# Patient Record
Sex: Female | Born: 1990 | Race: White | Hispanic: No | Marital: Married | Smoking: Former smoker
Health system: Southern US, Community
[De-identification: ages and names within clinical notes are randomized; demographics above are authoritative.]

## PROBLEM LIST (undated history)

## (undated) DIAGNOSIS — I878 Other specified disorders of veins: Secondary | ICD-10-CM

## (undated) DIAGNOSIS — G35 Multiple sclerosis: Secondary | ICD-10-CM

## (undated) DIAGNOSIS — Z915 Personal history of self-harm: Secondary | ICD-10-CM

## (undated) DIAGNOSIS — Z8739 Personal history of other diseases of the musculoskeletal system and connective tissue: Secondary | ICD-10-CM

## (undated) DIAGNOSIS — Z9151 Personal history of suicidal behavior: Secondary | ICD-10-CM

## (undated) DIAGNOSIS — Z87898 Personal history of other specified conditions: Secondary | ICD-10-CM

## (undated) DIAGNOSIS — G514 Facial myokymia: Secondary | ICD-10-CM

## (undated) DIAGNOSIS — Z98811 Dental restoration status: Secondary | ICD-10-CM

## (undated) DIAGNOSIS — F32A Depression, unspecified: Secondary | ICD-10-CM

## (undated) DIAGNOSIS — F419 Anxiety disorder, unspecified: Secondary | ICD-10-CM

## (undated) DIAGNOSIS — F329 Major depressive disorder, single episode, unspecified: Secondary | ICD-10-CM

## (undated) HISTORY — DX: Multiple sclerosis: G35

## (undated) HISTORY — PX: APPENDECTOMY: SHX54

---
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
## 2014-07-27 ENCOUNTER — Emergency Department (HOSPITAL_COMMUNITY): Payer: BC Managed Care – PPO

## 2014-07-27 ENCOUNTER — Emergency Department (HOSPITAL_COMMUNITY): Payer: Self-pay

## 2014-07-27 ENCOUNTER — Inpatient Hospital Stay (HOSPITAL_COMMUNITY)
Admission: EM | Admit: 2014-07-27 | Discharge: 2014-07-28 | DRG: 059 | Disposition: A | Discharge status: Home Health | Payer: BC Managed Care – PPO | Attending: Internal Medicine | Admitting: Internal Medicine

## 2014-07-27 ENCOUNTER — Encounter (HOSPITAL_COMMUNITY): Payer: Self-pay | Admitting: Emergency Medicine

## 2014-07-27 DIAGNOSIS — H5462 Unqualified visual loss, left eye, normal vision right eye: Secondary | ICD-10-CM

## 2014-07-27 DIAGNOSIS — G35 Multiple sclerosis: Principal | ICD-10-CM | POA: Diagnosis present

## 2014-07-27 DIAGNOSIS — Z87891 Personal history of nicotine dependence: Secondary | ICD-10-CM

## 2014-07-27 DIAGNOSIS — H53132 Sudden visual loss, left eye: Secondary | ICD-10-CM | POA: Diagnosis not present

## 2014-07-27 DIAGNOSIS — H469 Unspecified optic neuritis: Secondary | ICD-10-CM | POA: Diagnosis present

## 2014-07-27 MED ORDER — GADOBENATE DIMEGLUMINE 529 MG/ML IV SOLN
20.0000 mL | Freq: Once | INTRAVENOUS | Status: AC | PRN
  Administered 2014-07-27: 20 mL via INTRAVENOUS

## 2014-07-27 NOTE — ED Notes (Signed)
The patient was sent here from [facility] to get an MRI.   The patient went to [facility] because she woke up on Sunday and her vision was "dim".  Today she said she can only see "white and shiny" through her left eye.  [facility] hospital did a CT scan and it was negative.  They have sent here to do an MRI to rule out MS.

## 2014-07-27 NOTE — ED Notes (Signed)
Patient transported to MRI 

## 2014-07-28 ENCOUNTER — Encounter (HOSPITAL_COMMUNITY): Payer: Self-pay | Admitting: Internal Medicine

## 2014-07-28 DIAGNOSIS — H469 Unspecified optic neuritis: Secondary | ICD-10-CM | POA: Diagnosis present

## 2014-07-28 DIAGNOSIS — Z87891 Personal history of nicotine dependence: Secondary | ICD-10-CM | POA: Diagnosis not present

## 2014-07-28 DIAGNOSIS — G35 Multiple sclerosis: Secondary | ICD-10-CM | POA: Diagnosis present

## 2014-07-28 DIAGNOSIS — H53132 Sudden visual loss, left eye: Secondary | ICD-10-CM | POA: Diagnosis present

## 2014-07-28 DIAGNOSIS — H5462 Unqualified visual loss, left eye, normal vision right eye: Secondary | ICD-10-CM

## 2014-07-28 LAB — CBC WITH DIFFERENTIAL/PLATELET
BASOS PCT: 0 % (ref 0–1)
Basophils Absolute: 0 10*3/uL (ref 0.0–0.1)
Eosinophils Absolute: 0.1 10*3/uL (ref 0.0–0.7)
Eosinophils Relative: 1 % (ref 0–5)
HCT: 37.6 % (ref 36.0–46.0)
HEMOGLOBIN: 12.4 g/dL (ref 12.0–15.0)
LYMPHS ABS: 3 10*3/uL (ref 0.7–4.0)
Lymphocytes Relative: 48 % — ABNORMAL HIGH (ref 12–46)
MCH: 28.2 pg (ref 26.0–34.0)
MCHC: 33 g/dL (ref 30.0–36.0)
MCV: 85.5 fL (ref 78.0–100.0)
MONO ABS: 0.3 10*3/uL (ref 0.1–1.0)
MONOS PCT: 4 % (ref 3–12)
NEUTROS ABS: 2.9 10*3/uL (ref 1.7–7.7)
NEUTROS PCT: 47 % (ref 43–77)
Platelets: 267 10*3/uL (ref 150–400)
RBC: 4.4 MIL/uL (ref 3.87–5.11)
RDW: 13.3 % (ref 11.5–15.5)
WBC: 6.2 10*3/uL (ref 4.0–10.5)

## 2014-07-28 LAB — APTT: aPTT: 29 seconds (ref 24–37)

## 2014-07-28 LAB — BASIC METABOLIC PANEL
Anion gap: 12 (ref 5–15)
BUN: 9 mg/dL (ref 6–23)
CHLORIDE: 102 meq/L (ref 96–112)
CO2: 23 meq/L (ref 19–32)
Calcium: 9.4 mg/dL (ref 8.4–10.5)
Creatinine, Ser: 0.8 mg/dL (ref 0.50–1.10)
GFR calc Af Amer: >90 mL/min (ref >90)
GFR calc non Af Amer: >90 mL/min (ref >90)
GLUCOSE: 86 mg/dL (ref 70–99)
POTASSIUM: 3.7 meq/L (ref 3.7–5.3)
Sodium: 137 mEq/L (ref 137–147)

## 2014-07-28 LAB — SEDIMENTATION RATE: SED RATE: 18 mm/h (ref 0–22)

## 2014-07-28 LAB — TSH: TSH: 6.06 u[IU]/mL — AB (ref 0.350–4.500)

## 2014-07-28 LAB — T4, FREE: Free T4: 1 ng/dL (ref 0.80–1.80)

## 2014-07-28 LAB — PROTIME-INR
INR: 1.01 (ref 0.00–1.49)
Prothrombin Time: 13.4 seconds (ref 11.6–15.2)

## 2014-07-28 LAB — RPR

## 2014-07-28 LAB — HIV ANTIBODY (ROUTINE TESTING W REFLEX): HIV: NONREACTIVE

## 2014-07-28 MED ORDER — SODIUM CHLORIDE 0.9 % IV SOLN
500.0000 mg | Freq: Once | INTRAVENOUS | Status: AC
  Administered 2014-07-28: 500 mg via INTRAVENOUS
  Filled 2014-07-28: qty 4

## 2014-07-28 MED ORDER — NORGESTIMATE-ETH ESTRADIOL 0.25-35 MG-MCG PO TABS: 1.0000 | ORAL_TABLET | Freq: Every day | ORAL | Status: DC

## 2014-07-28 MED ORDER — SODIUM CHLORIDE 0.9 % IV SOLN: 1000.0000 mg | INTRAVENOUS | Status: DC

## 2014-07-28 MED ORDER — PANTOPRAZOLE SODIUM 40 MG PO TBEC: 40.0000 mg | DELAYED_RELEASE_TABLET | Freq: Every day | ORAL | Status: DC

## 2014-07-28 MED ORDER — SODIUM CHLORIDE 0.9 % IV SOLN
500.0000 mg | Freq: Two times a day (BID) | INTRAVENOUS | Status: DC
  Administered 2014-07-28 (×2): 500 mg via INTRAVENOUS
  Filled 2014-07-28 (×3): qty 4

## 2014-07-28 MED ORDER — SODIUM CHLORIDE 0.9 % IV SOLN: 500.0000 mg | Freq: Two times a day (BID) | INTRAVENOUS | Status: DC

## 2014-07-28 NOTE — Plan of Care (Signed)
Problem: Phase I Progression Outcomes Goal: Pain controlled with appropriate interventions Outcome: Completed/Met Date Met:  07/28/14 Goal: OOB as tolerated unless otherwise ordered Outcome: Completed/Met Date Met:  07/28/14 Goal: Voiding-avoid urinary catheter unless indicated Outcome: Completed/Met Date Met:  07/28/14

## 2014-07-28 NOTE — Discharge Summary (Signed)
Physician Discharge Summary  Patient ID: Aliesha Kreie MRN: [id number] DOB/AGE: 04-Mar-1991 23 y.o.  Admit date: 07/27/2014 Discharge date: 07/28/2014  Primary Care Physician:  No primary care provider on file.  Discharge Diagnoses:    . Optic neuritis Possible new diagnosis of MS   Consults:neurology, Dr. Hosie Poisson   Recommendations for Outpatient Follow-up:  Patient was recommended IV high-dose steroids for 5 days, home health arranged  Patient declined inpatient MRI of the C-spine, will need to be done outpatient  Please follow-up on T3 and T4, TSH is 6.0  Allergies:  No Known Allergies   Discharge Medications:   Medication List    TAKE these medications        methylPREDNISolone sodium succinate 1,000 mg in sodium chloride 0.9 % 50 mL  Inject 1,000 mg into the vein daily. X 4 more days  Start taking on:  07/29/2014     SPRINTEC 28 0.25-35 MG-MCG tablet  Generic drug:  norgestimate-ethinyl estradiol  Take 1 tablet by mouth daily.         Brief H and P: For complete details please refer to admission H and P, but in brief patient is a 23 year old female who presented for evaluation of acute vision loss in her left eye. Patient reported that her symptoms started 2 days prior to admission, sudden dimming of her vision in the left eye, flashes of light in wavy lines in her left eye. She noted clear vision in the left superior nasal quadrant otherwise per very. She also had discomfort in her left eye with right gaze, went to ophthalmologist who noted optic no problem and papilledema and patient was sent to the ED for further evaluation.  Hospital Course:     Optic neuritis: patient is a 23 year old female with unremarkable medical history presented with acute onset of visual loss in the left eye. Neurology was consulted. Patient underwent MRI of the brain which showed multiple hyperdense lesions involving the supratentorial white matter, highly concerning for multiple  sclerosis, 5 mL enhancing lesion in the left occipital lobe consistent with active demyelination, no other enhancing lesions identified in the brain. Asymmetric enhancement in the prechiasmatic left optic nerve suspicious for active optic neuritis. Neurology recommended starting high-dose IV Solu-Medrol, for total of 5 days. Discussed in detail with Dr. Amada Jupiter prior to discharge, who did not think patient needs an LP at this time, the symptoms are most consistent with multiple sclerosis and recommended IV Solu-Medrol for 5 days.  Patient requested to be discharged, her mother is a Engineer, civil (consulting) and reported comfort with giving her IV Solu-Medrol at home.home health was arranged with case manager assistance. Patient declined inpatient MRI of the C-spine, will need to be done outpatient.  Day of Discharge BP 120/71 mmHg  Pulse 70  Temp(Src) 97.8 F (36.6 C) (Oral)  Resp 16  Ht 5\' 10"  (1.778 m)  Wt 101.2 kg (223 lb 1.7 oz)  BMI 32.01 kg/m2  SpO2 100%  LMP 06/29/2014  Physical Exam: General: Alert and awake oriented x3 not in any acute distress. CVS: S1-S2 clear no murmur rubs or gallops Chest: clear to auscultation bilaterally, no wheezing rales or rhonchi Abdomen: soft nontender, nondistended, normal bowel sounds Extremities: no cyanosis, clubbing or edema noted bilaterally    The results of significant diagnostics from this hospitalization (including imaging, microbiology, ancillary and laboratory) are listed below for reference.    LAB RESULTS: Basic Metabolic Panel:  Recent Labs Lab 07/28/14 0005  NA 137  K 3.7  CL 102  CO2 23  GLUCOSE 86  BUN 9  CREATININE 0.80  CALCIUM 9.4   Liver Function Tests: No results for input(s): AST, ALT, ALKPHOS, BILITOT, PROT, ALBUMIN in the last 168 hours. No results for input(s): LIPASE, AMYLASE in the last 168 hours. No results for input(s): AMMONIA in the last 168 hours. CBC:  Recent Labs Lab 07/28/14 0005  WBC 6.2  NEUTROABS 2.9   HGB 12.4  HCT 37.6  MCV 85.5  PLT 267   Cardiac Enzymes: No results for input(s): CKTOTAL, CKMB, CKMBINDEX, TROPONINI in the last 168 hours. BNP: Invalid input(s): POCBNP CBG: No results for input(s): GLUCAP in the last 168 hours.  Significant Diagnostic Studies:  Mr Lodema Pilot Contrast  07/28/2014   CLINICAL DATA:  Initial evaluation for acute vision loss and left eye. Concern for demyelinating disease.  EXAM: MRI HEAD AND ORBITS WITHOUT AND WITH CONTRAST  TECHNIQUE: Multiplanar, multiecho pulse sequences of the brain and surrounding structures were obtained without and with intravenous contrast. Multiplanar, multiecho pulse sequences of the orbits and surrounding structures were obtained including fat saturation techniques, before and after intravenous contrast administration.  CONTRAST:  20mL MULTIHANCE GADOBENATE DIMEGLUMINE 529 MG/ML IV SOLN  COMPARISON:  None available.  FINDINGS: MRI HEAD FINDINGS  Multiple patchy T2/FLAIR hyperintensities are seen involving the white matter of both cerebral hemispheres. Specifically, these involve the periventricular, deep, and juxta cortical white matter. Several of these lesions are oriented perpendicular to the lateral ventricles, most evident on axial FLAIR sequence (series 6, image 19). The most prominent lesion is a juxta cortical lesion within the anterior left frontal lobe which measures 1.4 cm (series 6, image 21). No infratentorial lesions identified. A single 5 mm lesion within the left occipital cortex demonstrates post-contrast enhancement, suggesting active demyelination (series 17, image 34). No other enhancing lesions identified. Findings are highly suspicious for multiple sclerosis.  No abnormal foci of restricted diffusion identified to suggest acute intracranial infarct. Gray-white matter differentiation maintained. Normal flow voids seen within the intracranial vasculature. No intracranial hemorrhage.  No mass lesion or midline shift.  Ventricles are normal in size without evidence of hydrocephalus. No extra-axial fluid collection.  Incidental note made of a small 6 mm nonenhancing T1 hypointense, T2 hyperintense cyst within the pineal gland. Pituitary gland unremarkable.  Craniocervical junction within normal limits. Visualized upper cervical spine is unremarkable.  Visualized bone marrow is within normal limits. Scalp soft tissues are unremarkable.  Paranasal sinuses are clear. No mastoid effusion. Inner ear structures are within normal limits.  MRI ORBITS FINDINGS  There is asymmetric enhancement within the pre chiasmatic left optic nerve near the left orbital apex (series 20, image 18). The left optic nerve is slightly swollen in appearance at this level as well. The optic chiasm itself is within normal limits. No definite abnormal enhancement seen within the right optic nerve. Optic chiasm is normal. Globes themselves are within normal limits. Extraocular muscles are normal. Intraconal and extraconal fat is within normal limits.  IMPRESSION: 1. Multiple T2/FLAIR hyperintense lesions involving the supratentorial white matter as above, highly concerning for multiple sclerosis. 5 mm enhancing lesion within the left occipital lobe most consistent with active demyelination. No other enhancing lesions identified within the brain. 2. Asymmetric enhancement within the pre chiasmatic left optic nerve, suspicious for active optic neuritis.   Electronically Signed   By: Rise Mu M.D.   On: 07/28/2014 00:02   Mr Birdie Hopes Wo/w Cm  07/28/2014   CLINICAL DATA:  Initial evaluation for acute  vision loss and left eye. Concern for demyelinating disease.  EXAM: MRI HEAD AND ORBITS WITHOUT AND WITH CONTRAST  TECHNIQUE: Multiplanar, multiecho pulse sequences of the brain and surrounding structures were obtained without and with intravenous contrast. Multiplanar, multiecho pulse sequences of the orbits and surrounding structures were obtained  including fat saturation techniques, before and after intravenous contrast administration.  CONTRAST:  20mL MULTIHANCE GADOBENATE DIMEGLUMINE 529 MG/ML IV SOLN  COMPARISON:  None available.  FINDINGS: MRI HEAD FINDINGS  Multiple patchy T2/FLAIR hyperintensities are seen involving the white matter of both cerebral hemispheres. Specifically, these involve the periventricular, deep, and juxta cortical white matter. Several of these lesions are oriented perpendicular to the lateral ventricles, most evident on axial FLAIR sequence (series 6, image 19). The most prominent lesion is a juxta cortical lesion within the anterior left frontal lobe which measures 1.4 cm (series 6, image 21). No infratentorial lesions identified. A single 5 mm lesion within the left occipital cortex demonstrates post-contrast enhancement, suggesting active demyelination (series 17, image 34). No other enhancing lesions identified. Findings are highly suspicious for multiple sclerosis.  No abnormal foci of restricted diffusion identified to suggest acute intracranial infarct. Gray-white matter differentiation maintained. Normal flow voids seen within the intracranial vasculature. No intracranial hemorrhage.  No mass lesion or midline shift. Ventricles are normal in size without evidence of hydrocephalus. No extra-axial fluid collection.  Incidental note made of a small 6 mm nonenhancing T1 hypointense, T2 hyperintense cyst within the pineal gland. Pituitary gland unremarkable.  Craniocervical junction within normal limits. Visualized upper cervical spine is unremarkable.  Visualized bone marrow is within normal limits. Scalp soft tissues are unremarkable.  Paranasal sinuses are clear. No mastoid effusion. Inner ear structures are within normal limits.  MRI ORBITS FINDINGS  There is asymmetric enhancement within the pre chiasmatic left optic nerve near the left orbital apex (series 20, image 18). The left optic nerve is slightly swollen in  appearance at this level as well. The optic chiasm itself is within normal limits. No definite abnormal enhancement seen within the right optic nerve. Optic chiasm is normal. Globes themselves are within normal limits. Extraocular muscles are normal. Intraconal and extraconal fat is within normal limits.  IMPRESSION: 1. Multiple T2/FLAIR hyperintense lesions involving the supratentorial white matter as above, highly concerning for multiple sclerosis. 5 mm enhancing lesion within the left occipital lobe most consistent with active demyelination. No other enhancing lesions identified within the brain. 2. Asymmetric enhancement within the pre chiasmatic left optic nerve, suspicious for active optic neuritis.   Electronically Signed   By: Rise MuBenjamin  McClintock M.D.   On: 07/28/2014 00:02       Disposition and Follow-up: Discharge Instructions    Diet general    Complete by:  As directed      Increase activity slowly    Complete by:  As directed             DISPOSITION:home  DIET:regular diet    DISCHARGE FOLLOW-UP Follow-up Information    Follow up with JAFFE, ADAM ROBERT, DO.   Specialty:  Neurology   Why:  OFFICE WILL CALL YOU FOR APPOINTMENT DATE AND TIME, for hospital follow-up   Contact information:   [address] [place] [id number] [phone]       Time spent on Discharge: 35 mins  Signed:   Haruko Mersch M.D. [facility] 07/28/2014, 1:29 PM Pager: [phone]

## 2014-07-28 NOTE — H&P (Addendum)
Beth Santos is an 23 y.o. female.    Pcp: unassigned  Chief Complaint: vision dim HPI: 23 yo female with c/o vision dim and colors not bright,  Starting on Sunday, had increased vision loss in the left eye,  Lower and outer visual field.  Pt went to opthamologist, who told her that the optic nerve was inflammed, and sent her to [facility].  CT scan was negative . Couldn't get MRI at [facility] so came to [facility] for evaluation.  MRI brain showed optic neuritis,   History reviewed. No pertinent past medical history.  Past Surgical History  Procedure Laterality Date  . Appendectomy      History reviewed. No pertinent family history. Social History:  reports that she has quit smoking. She has never used smokeless tobacco. She reports that she drinks alcohol. She reports that she does not use illicit drugs.  Allergies: No Known Allergies   (Not in a hospital admission)  No results found for this or any previous visit (from the past 48 hour(s)). Mr Beth Santos Wo Contrast  07/28/2014   CLINICAL DATA:  Initial evaluation for acute vision loss and left eye. Concern for demyelinating disease.  EXAM: MRI HEAD AND ORBITS WITHOUT AND WITH CONTRAST  TECHNIQUE: Multiplanar, multiecho pulse sequences of the brain and surrounding structures were obtained without and with intravenous contrast. Multiplanar, multiecho pulse sequences of the orbits and surrounding structures were obtained including fat saturation techniques, before and after intravenous contrast administration.  CONTRAST:  40m MULTIHANCE GADOBENATE DIMEGLUMINE 529 MG/ML IV SOLN  COMPARISON:  None available.  FINDINGS: MRI HEAD FINDINGS  Multiple patchy T2/FLAIR hyperintensities are seen involving the white matter of both cerebral hemispheres. Specifically, these involve the periventricular, deep, and juxta cortical white matter. Several of these lesions are oriented perpendicular to the lateral ventricles, most evident on  axial FLAIR sequence (series 6, image 19). The most prominent lesion is a juxta cortical lesion within the anterior left frontal lobe which measures 1.4 cm (series 6, image 21). No infratentorial lesions identified. A single 5 mm lesion within the left occipital cortex demonstrates post-contrast enhancement, suggesting active demyelination (series 17, image 34). No other enhancing lesions identified. Findings are highly suspicious for multiple sclerosis.  No abnormal foci of restricted diffusion identified to suggest acute intracranial infarct. Gray-white matter differentiation maintained. Normal flow voids seen within the intracranial vasculature. No intracranial hemorrhage.  No mass lesion or midline shift. Ventricles are normal in size without evidence of hydrocephalus. No extra-axial fluid collection.  Incidental note made of a small 6 mm nonenhancing T1 hypointense, T2 hyperintense cyst within the pineal gland. Pituitary gland unremarkable.  Craniocervical junction within normal limits. Visualized upper cervical spine is unremarkable.  Visualized bone marrow is within normal limits. Scalp soft tissues are unremarkable.  Paranasal sinuses are clear. No mastoid effusion. Inner ear structures are within normal limits.  MRI ORBITS FINDINGS  There is asymmetric enhancement within the pre chiasmatic left optic nerve near the left orbital apex (series 20, image 18). The left optic nerve is slightly swollen in appearance at this level as well. The optic chiasm itself is within normal limits. No definite abnormal enhancement seen within the right optic nerve. Optic chiasm is normal. Globes themselves are within normal limits. Extraocular muscles are normal. Intraconal and extraconal fat is within normal limits.  IMPRESSION: 1. Multiple T2/FLAIR hyperintense lesions involving the supratentorial white matter as above, highly concerning for multiple sclerosis. 5 mm enhancing lesion within the left occipital  lobe most  consistent with active demyelination. No other enhancing lesions identified within the brain. 2. Asymmetric enhancement within the pre chiasmatic left optic nerve, suspicious for active optic neuritis.   Electronically Signed   By: Jeannine Boga M.D.   On: 07/28/2014 00:02   Mr Beth Santos Wo/w Cm  07/28/2014   CLINICAL DATA:  Initial evaluation for acute vision loss and left eye. Concern for demyelinating disease.  EXAM: MRI HEAD AND ORBITS WITHOUT AND WITH CONTRAST  TECHNIQUE: Multiplanar, multiecho pulse sequences of the brain and surrounding structures were obtained without and with intravenous contrast. Multiplanar, multiecho pulse sequences of the orbits and surrounding structures were obtained including fat saturation techniques, before and after intravenous contrast administration.  CONTRAST:  34m MULTIHANCE GADOBENATE DIMEGLUMINE 529 MG/ML IV SOLN  COMPARISON:  None available.  FINDINGS: MRI HEAD FINDINGS  Multiple patchy T2/FLAIR hyperintensities are seen involving the white matter of both cerebral hemispheres. Specifically, these involve the periventricular, deep, and juxta cortical white matter. Several of these lesions are oriented perpendicular to the lateral ventricles, most evident on axial FLAIR sequence (series 6, image 19). The most prominent lesion is a juxta cortical lesion within the anterior left frontal lobe which measures 1.4 cm (series 6, image 21). No infratentorial lesions identified. A single 5 mm lesion within the left occipital cortex demonstrates post-contrast enhancement, suggesting active demyelination (series 17, image 34). No other enhancing lesions identified. Findings are highly suspicious for multiple sclerosis.  No abnormal foci of restricted diffusion identified to suggest acute intracranial infarct. Gray-white matter differentiation maintained. Normal flow voids seen within the intracranial vasculature. No intracranial hemorrhage.  No mass lesion or midline shift.  Ventricles are normal in size without evidence of hydrocephalus. No extra-axial fluid collection.  Incidental note made of a small 6 mm nonenhancing T1 hypointense, T2 hyperintense cyst within the pineal gland. Pituitary gland unremarkable.  Craniocervical junction within normal limits. Visualized upper cervical spine is unremarkable.  Visualized bone marrow is within normal limits. Scalp soft tissues are unremarkable.  Paranasal sinuses are clear. No mastoid effusion. Inner ear structures are within normal limits.  MRI ORBITS FINDINGS  There is asymmetric enhancement within the pre chiasmatic left optic nerve near the left orbital apex (series 20, image 18). The left optic nerve is slightly swollen in appearance at this level as well. The optic chiasm itself is within normal limits. No definite abnormal enhancement seen within the right optic nerve. Optic chiasm is normal. Globes themselves are within normal limits. Extraocular muscles are normal. Intraconal and extraconal fat is within normal limits.  IMPRESSION: 1. Multiple T2/FLAIR hyperintense lesions involving the supratentorial white matter as above, highly concerning for multiple sclerosis. 5 mm enhancing lesion within the left occipital lobe most consistent with active demyelination. No other enhancing lesions identified within the brain. 2. Asymmetric enhancement within the pre chiasmatic left optic nerve, suspicious for active optic neuritis.   Electronically Signed   By: BJeannine BogaM.D.   On: 07/28/2014 00:02    Review of Systems  Constitutional: Negative for fever, chills, weight loss, malaise/fatigue and diaphoresis.  HENT: Negative for congestion, ear discharge, ear pain, hearing loss, nosebleeds, sore throat and tinnitus.   Eyes: Negative for blurred vision, double vision, photophobia, pain, discharge and redness.  Respiratory: Negative for cough, hemoptysis, sputum production, shortness of breath, wheezing and stridor.    Cardiovascular: Negative for chest pain, palpitations, orthopnea, claudication, leg swelling and PND.  Gastrointestinal: Negative for heartburn, nausea, vomiting, abdominal pain, diarrhea, constipation, blood  in stool and melena.  Genitourinary: Negative for dysuria, urgency, frequency, hematuria and flank pain.  Musculoskeletal: Negative for myalgias, back pain, joint pain, falls and neck pain.  Skin: Negative for itching and rash.  Neurological: Negative for dizziness, tingling, tremors, sensory change, speech change, focal weakness, seizures, loss of consciousness, weakness and headaches.  Endo/Heme/Allergies: Negative for environmental allergies and polydipsia. Does not bruise/bleed easily.  Psychiatric/Behavioral: Negative for depression, suicidal ideas, hallucinations, memory loss and substance abuse. The patient is not nervous/anxious and does not have insomnia.     Blood pressure 141/77, pulse 88, temperature 98.9 F (37.2 C), resp. rate 18, weight 118.956 kg (262 lb 4 oz), last menstrual period 06/29/2014, SpO2 99 %. Physical Exam  Constitutional: She is oriented to person, place, and time. She appears well-developed and well-nourished.  HENT:  Head: Normocephalic and atraumatic.  Eyes: Conjunctivae and EOM are normal. Pupils are equal, round, and reactive to light.  Visual field lower outer decreased in left eye  Neck: Normal range of motion. Neck supple. No JVD present. No tracheal deviation present. No thyromegaly present.  Cardiovascular: Normal rate and regular rhythm.  Exam reveals no gallop and no friction rub.   No murmur heard. Respiratory: Breath sounds normal. No stridor. No respiratory distress. She has no wheezes. She has no rales. She exhibits no tenderness.  GI: Soft. Bowel sounds are normal. She exhibits no distension and no mass. There is no tenderness. There is no rebound and no guarding.  Musculoskeletal: Normal range of motion. She exhibits no edema or  tenderness.  Lymphadenopathy:    She has no cervical adenopathy.  Neurological: She is alert and oriented to person, place, and time. She has normal reflexes. She displays normal reflexes. No cranial nerve deficit. She exhibits normal muscle tone. Coordination normal.  Skin: Skin is warm and dry. No rash noted. No erythema. No pallor.  Psychiatric: She has a normal mood and affect. Her behavior is normal. Judgment and thought content normal.     Assessment/Plan Vision loss  ? Due to multiple sclerosis/optic neuritis Start on solumedrol 55m iv bid  Check tsh, rpr , ana, esr, pt.ptt, hiv Consider MRI c spine, t spine, L spine and May need LP to confirm and look for oligoclonal bands,  Defer to neurolgy Appreciate neurology input   KJani Gravel11/18/2015, 12:31 AM

## 2014-07-28 NOTE — Progress Notes (Signed)
Discharge instructions gone over with patient. Home medications gone over. Prescription given. Follow up appointment to be made. Patient received copied disc of MRI. Also faxed requested information to Dr.Applegate per patient request, and obtained signed release of information. My chart discussed. Follow up with Dr. Adriana Mccallum to be made. Diet and activity discussed. Work note given. Patient verbalized understanding of instructions. Home health is following patient for iv steroids and nsl was left in.

## 2014-07-28 NOTE — Plan of Care (Signed)
Problem: Discharge Progression Outcomes Goal: Discharge plan in place and appropriate Outcome: Completed/Met Date Met:  07/28/14 Goal: Pain controlled with appropriate interventions Outcome: Completed/Met Date Met:  07/28/14 Goal: Hemodynamically stable Outcome: Completed/Met Date Met:  70/14/10 Goal: Complications resolved/controlled Outcome: Completed/Met Date Met:  07/28/14 Goal: Tolerating diet Outcome: Completed/Met Date Met:  07/28/14 Goal: Activity appropriate for discharge plan Outcome: Completed/Met Date Met:  07/28/14

## 2014-07-28 NOTE — Progress Notes (Signed)
Subjective: Patient has remembered previous sepisode of left foot numbness that lasted approximately one week.   Exam: Filed Vitals:   07/28/14 1317  BP: 120/71  Pulse: 70  Temp: 97.8 F (36.6 C)  Resp: 16   Gen: In bed, NAD MS: awake, alert, oriented CN:APD on left, EOMI, VFF Motor: 5/5 throughout Sensory:intact to LT   Impression: 23 yo F with optic neuritis and at least one previous clinical attack. 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   Recommendations: 1) IV solumedrol 1gm daily x 5 days.  2) F/U as outpatient to start immunomodulating therapy.   Ritta Slot, MD [facility] [phone]  If 7pm- 7am, please page neurology on call as listed in AMION.

## 2014-07-28 NOTE — Care Management (Signed)
[facility] set up for [facility] for IV Solumedrol. Ronny Flurry RN BSN [phone]

## 2014-07-28 NOTE — Consult Note (Signed)
Consult Reason for Consult:vision loss Referring Physician: Dr Wilkie Aye [facility] ED  CC: vision loss  HPI: Beth Santos is an 23 y.o. female presenting for evaluation of acute vision loss in her left eye. Symptoms started 2 days ago, notes a sudden dimming of her vision in her left eye, then noted flashes of light and wavy lines in her left eye. Notes clear vision in left superior nasal quadrant, otherwise vision is blurry. Notes discomfort in her left eye with right gaze. Denies any symptoms in her right eye. Went to her eye doctor who noted "an optic nerve problem and papilledema", sent to the ED for further evaluation.    MRI brain and orbits images personally reviewed with findings concerning for MS.  Official read: 1. Multiple T2/FLAIR hyperintense lesions involving the supratentorial white matter as above, highly concerning for multiple sclerosis. 5 mm enhancing lesion within the left occipital lobe most consistent with active demyelination. No other enhancing lesions identified within the brain. 2. Asymmetric enhancement within the pre chiasmatic left optic nerve, suspicious for active optic neuritis.  History reviewed. No pertinent past medical history.  Past Surgical History  Procedure Laterality Date  . Appendectomy      History reviewed. No pertinent family history.  Social History:  reports that she has quit smoking. She has never used smokeless tobacco. She reports that she drinks alcohol. She reports that she does not use illicit drugs.  No Known Allergies  Medications: Prior to Admission:  (Not in a hospital admission)   ROS: Out of a complete 14 system review, the patient complains of only the following symptoms, and all other reviewed systems are negative.  Physical Examination: Blood pressure 141/77, pulse 88, temperature 98.9 F (37.2 C), resp. rate 18, weight 118.956 kg (262 lb 4 oz), last menstrual period 06/29/2014, SpO2 99 %.  Neurologic Examination Mental  Status: Alert, oriented, thought content appropriate.  Speech fluent without evidence of aphasia.  Able to follow 3 step commands without difficulty. Cranial Nerves: II: blurring of optic disc on the left, loss of temporal and inferior nasal VF in OS, intact VF OD, pupils equal, round, reactive to light and accommodation. VA 20/20 OD, 20/200 OS III,IV, VI: ptosis not present, extra-ocular motions intact bilaterally V,VII: smile symmetric, facial light touch sensation normal bilaterally VIII: hearing normal bilaterally IX,X: gag reflex present XI: trapezius strength/neck flexion strength normal bilaterally XII: tongue strength normal  Motor: Right : Upper extremity    Left:     Upper extremity 5/5 deltoid       5/5 deltoid 5/5 biceps      5/5 biceps  5/5 triceps      5/5 triceps 5/5 hand grip      5/5 hand grip  Lower extremity     Lower extremity 5/5 hip flexor      5/5 hip flexor 5/5 quadricep      5/5 quadriceps  5/5 hamstrings     5/5 hamstrings 5/5 plantar flexion       5/5 plantar flexion 5/5 plantar extension     5/5 plantar extension Tone and bulk:normal tone throughout; no atrophy noted Sensory: Pinprick and light touch intact throughout, bilaterally Deep Tendon Reflexes: 2+ and symmetric throughout Plantars: Right: downgoing   Left: downgoing Cerebellar: normal finger-to-nose, normal rapid alternating movements and normal heel-to-shin test Gait: deferred due to multiple leads on in ED  Laboratory Studies:   Basic Metabolic Panel: No results for input(s): NA, K, CL, CO2, GLUCOSE, BUN, CREATININE, CALCIUM, MG,  PHOS in the last 168 hours.  Liver Function Tests: No results for input(s): AST, ALT, ALKPHOS, BILITOT, PROT, ALBUMIN in the last 168 hours. No results for input(s): LIPASE, AMYLASE in the last 168 hours. No results for input(s): AMMONIA in the last 168 hours.  CBC: No results for input(s): WBC, NEUTROABS, HGB, HCT, MCV, PLT in the last 168 hours.  Cardiac  Enzymes: No results for input(s): CKTOTAL, CKMB, CKMBINDEX, TROPONINI in the last 168 hours.  BNP: Invalid input(s): POCBNP  CBG: No results for input(s): GLUCAP in the last 168 hours.  Microbiology: No results found for this or any previous visit.  Coagulation Studies: No results for input(s): LABPROT, INR in the last 72 hours.  Urinalysis: No results for input(s): COLORURINE, LABSPEC, PHURINE, GLUCOSEU, HGBUR, BILIRUBINUR, KETONESUR, PROTEINUR, UROBILINOGEN, NITRITE, LEUKOCYTESUR in the last 168 hours.  Invalid input(s): APPERANCEUR  Lipid Panel:  No results found for: CHOL, TRIG, HDL, CHOLHDL, VLDL, LDLCALC  HgbA1C: No results found for: HGBA1C  Urine Drug Screen:  No results found for: LABOPIA, COCAINSCRNUR, LABBENZ, AMPHETMU, THCU, LABBARB  Alcohol Level: No results for input(s): ETH in the last 168 hours.  Other results:  Imaging: Mr Lodema Pilot Contrast  07/28/2014   CLINICAL DATA:  Initial evaluation for acute vision loss and left eye. Concern for demyelinating disease.  EXAM: MRI HEAD AND ORBITS WITHOUT AND WITH CONTRAST  TECHNIQUE: Multiplanar, multiecho pulse sequences of the brain and surrounding structures were obtained without and with intravenous contrast. Multiplanar, multiecho pulse sequences of the orbits and surrounding structures were obtained including fat saturation techniques, before and after intravenous contrast administration.  CONTRAST:  13mL MULTIHANCE GADOBENATE DIMEGLUMINE 529 MG/ML IV SOLN  COMPARISON:  None available.  FINDINGS: MRI HEAD FINDINGS  Multiple patchy T2/FLAIR hyperintensities are seen involving the white matter of both cerebral hemispheres. Specifically, these involve the periventricular, deep, and juxta cortical white matter. Several of these lesions are oriented perpendicular to the lateral ventricles, most evident on axial FLAIR sequence (series 6, image 19). The most prominent lesion is a juxta cortical lesion within the anterior left  frontal lobe which measures 1.4 cm (series 6, image 21). No infratentorial lesions identified. A single 5 mm lesion within the left occipital cortex demonstrates post-contrast enhancement, suggesting active demyelination (series 17, image 34). No other enhancing lesions identified. Findings are highly suspicious for multiple sclerosis.  No abnormal foci of restricted diffusion identified to suggest acute intracranial infarct. Gray-white matter differentiation maintained. Normal flow voids seen within the intracranial vasculature. No intracranial hemorrhage.  No mass lesion or midline shift. Ventricles are normal in size without evidence of hydrocephalus. No extra-axial fluid collection.  Incidental note made of a small 6 mm nonenhancing T1 hypointense, T2 hyperintense cyst within the pineal gland. Pituitary gland unremarkable.  Craniocervical junction within normal limits. Visualized upper cervical spine is unremarkable.  Visualized bone marrow is within normal limits. Scalp soft tissues are unremarkable.  Paranasal sinuses are clear. No mastoid effusion. Inner ear structures are within normal limits.  MRI ORBITS FINDINGS  There is asymmetric enhancement within the pre chiasmatic left optic nerve near the left orbital apex (series 20, image 18). The left optic nerve is slightly swollen in appearance at this level as well. The optic chiasm itself is within normal limits. No definite abnormal enhancement seen within the right optic nerve. Optic chiasm is normal. Globes themselves are within normal limits. Extraocular muscles are normal. Intraconal and extraconal fat is within normal limits.  IMPRESSION: 1. Multiple T2/FLAIR  hyperintense lesions involving the supratentorial white matter as above, highly concerning for multiple sclerosis. 5 mm enhancing lesion within the left occipital lobe most consistent with active demyelination. No other enhancing lesions identified within the brain. 2. Asymmetric enhancement within  the pre chiasmatic left optic nerve, suspicious for active optic neuritis.   Electronically Signed   By: Rise Mu M.D.   On: 07/28/2014 00:02   Mr Birdie Hopes Wo/w Cm  07/28/2014   CLINICAL DATA:  Initial evaluation for acute vision loss and left eye. Concern for demyelinating disease.  EXAM: MRI HEAD AND ORBITS WITHOUT AND WITH CONTRAST  TECHNIQUE: Multiplanar, multiecho pulse sequences of the brain and surrounding structures were obtained without and with intravenous contrast. Multiplanar, multiecho pulse sequences of the orbits and surrounding structures were obtained including fat saturation techniques, before and after intravenous contrast administration.  CONTRAST:  20mL MULTIHANCE GADOBENATE DIMEGLUMINE 529 MG/ML IV SOLN  COMPARISON:  None available.  FINDINGS: MRI HEAD FINDINGS  Multiple patchy T2/FLAIR hyperintensities are seen involving the white matter of both cerebral hemispheres. Specifically, these involve the periventricular, deep, and juxta cortical white matter. Several of these lesions are oriented perpendicular to the lateral ventricles, most evident on axial FLAIR sequence (series 6, image 19). The most prominent lesion is a juxta cortical lesion within the anterior left frontal lobe which measures 1.4 cm (series 6, image 21). No infratentorial lesions identified. A single 5 mm lesion within the left occipital cortex demonstrates post-contrast enhancement, suggesting active demyelination (series 17, image 34). No other enhancing lesions identified. Findings are highly suspicious for multiple sclerosis.  No abnormal foci of restricted diffusion identified to suggest acute intracranial infarct. Gray-white matter differentiation maintained. Normal flow voids seen within the intracranial vasculature. No intracranial hemorrhage.  No mass lesion or midline shift. Ventricles are normal in size without evidence of hydrocephalus. No extra-axial fluid collection.  Incidental note made of a small  6 mm nonenhancing T1 hypointense, T2 hyperintense cyst within the pineal gland. Pituitary gland unremarkable.  Craniocervical junction within normal limits. Visualized upper cervical spine is unremarkable.  Visualized bone marrow is within normal limits. Scalp soft tissues are unremarkable.  Paranasal sinuses are clear. No mastoid effusion. Inner ear structures are within normal limits.  MRI ORBITS FINDINGS  There is asymmetric enhancement within the pre chiasmatic left optic nerve near the left orbital apex (series 20, image 18). The left optic nerve is slightly swollen in appearance at this level as well. The optic chiasm itself is within normal limits. No definite abnormal enhancement seen within the right optic nerve. Optic chiasm is normal. Globes themselves are within normal limits. Extraocular muscles are normal. Intraconal and extraconal fat is within normal limits.  IMPRESSION: 1. Multiple T2/FLAIR hyperintense lesions involving the supratentorial white matter as above, highly concerning for multiple sclerosis. 5 mm enhancing lesion within the left occipital lobe most consistent with active demyelination. No other enhancing lesions identified within the brain. 2. Asymmetric enhancement within the pre chiasmatic left optic nerve, suspicious for active optic neuritis.   Electronically Signed   By: Rise Mu M.D.   On: 07/28/2014 00:02     Assessment/Plan:  23y/o woman with unremarkable past medical history presenting for evaluation of acute onset vision loss in her left eye. Clinical history consistent with a diagnosis of optic neuritis. MRI brain and orbit findings concerning for MS.   -IV solumedrol 500mg  BID x 3 days with prednisone taper -GI prophylaxis while on steroids -check MRI C spine with and without  contrast -hold on LP at this time -will need outpatient neurology follow up  Elspeth ChoPeter Artie Takayama, DO [facility] [phone]  If 7pm- 7am, please page neurology on  call as listed in AMION. 07/28/2014, 12:30 AM

## 2014-07-28 NOTE — ED Provider Notes (Signed)
CSN: [id number]     Arrival date & time 07/27/14  2027 History   First MD Initiated Contact with Patient 07/27/14 2305     Chief Complaint  Patient presents with  . Loss of Vision    The patient was sent here from [facility] to get an MRI.       (Consider location/radiation/quality/duration/timing/severity/associated sxs/prior Treatment) HPI  This is a 23 year old female with no significant PMH who presents with vision loss in the left eye.  Patient reports onset of symptoms onset Sunday. Patient reports that her vision got "dim." She thought that she needed new glasses. She subsequently has had flashes of light and more "dim and wavy vision out of the left eye." Patient states that she can only see clearly out of the left upper nasal field.  Right eye does not seem to be affected. She denies any prior history of similar symptoms. She denies any headache, weakness, numbness, tingling. Patient did see her eye doctor and he noted "an optic nerve problem." She was sent to the hospital for evaluation for MS. Outside hospital did a CT scan but were unable to perform MRI.  History reviewed. No pertinent past medical history. Past Surgical History  Procedure Laterality Date  . Appendectomy     History reviewed. No pertinent family history. History  Substance Use Topics  . Smoking status: Former Smoker -- 0.50 packs/day  . Smokeless tobacco: Never Used  . Alcohol Use: Yes   OB History    No data available     Review of Systems  Constitutional: Negative for fever.  Eyes: Positive for visual disturbance. Negative for photophobia, pain and redness.  Respiratory: Negative for chest tightness and shortness of breath.   Cardiovascular: Negative for chest pain.  Gastrointestinal: Negative for nausea, vomiting and abdominal pain.  Neurological: Negative for dizziness, weakness, numbness and headaches.  All other systems reviewed and are negative.     Allergies  Review of patient's  allergies indicates no known allergies.  Home Medications   Prior to Admission medications   Medication Sig Start Date End Date Taking? Authorizing Provider  norgestimate-ethinyl estradiol (SPRINTEC 28) 0.25-35 MG-MCG tablet Take 1 tablet by mouth daily.   Yes Historical Provider, MD   BP 141/77 mmHg  Pulse 88  Temp(Src) 98.9 F (37.2 C)  Resp 18  Wt 262 lb 4 oz (118.956 kg)  SpO2 99%  LMP 06/29/2014 Physical Exam  Constitutional: She is oriented to person, place, and time. She appears well-developed and well-nourished.  overweight  HENT:  Head: Normocephalic and atraumatic.  Mouth/Throat: Oropharynx is clear and moist.  Eyes: Conjunctivae and EOM are normal. Pupils are equal, round, and reactive to light.  Cardiovascular: Normal rate, regular rhythm and normal heart sounds.   Pulmonary/Chest: Effort normal. No respiratory distress. She has no wheezes.  Neurological: She is alert and oriented to person, place, and time.  Visual field deficit isolated to the left upper nasal field, otherwise normal, 5 out of 5 strength in all 4 extremities  Skin: Skin is warm and dry.  Psychiatric: She has a normal mood and affect.  Nursing note and vitals reviewed.   ED Course  Procedures (including critical care time) Labs Review Labs Reviewed  CBC WITH DIFFERENTIAL  BASIC METABOLIC PANEL    Imaging Review Mr Laqueta Jean Wo Contrast  07/28/2014   CLINICAL DATA:  Initial evaluation for acute vision loss and left eye. Concern for demyelinating disease.  EXAM: MRI HEAD AND ORBITS  WITHOUT AND WITH CONTRAST  TECHNIQUE: Multiplanar, multiecho pulse sequences of the brain and surrounding structures were obtained without and with intravenous contrast. Multiplanar, multiecho pulse sequences of the orbits and surrounding structures were obtained including fat saturation techniques, before and after intravenous contrast administration.  CONTRAST:  20mL MULTIHANCE GADOBENATE DIMEGLUMINE 529 MG/ML IV SOLN   COMPARISON:  None available.  FINDINGS: MRI HEAD FINDINGS  Multiple patchy T2/FLAIR hyperintensities are seen involving the white matter of both cerebral hemispheres. Specifically, these involve the periventricular, deep, and juxta cortical white matter. Several of these lesions are oriented perpendicular to the lateral ventricles, most evident on axial FLAIR sequence (series 6, image 19). The most prominent lesion is a juxta cortical lesion within the anterior left frontal lobe which measures 1.4 cm (series 6, image 21). No infratentorial lesions identified. A single 5 mm lesion within the left occipital cortex demonstrates post-contrast enhancement, suggesting active demyelination (series 17, image 34). No other enhancing lesions identified. Findings are highly suspicious for multiple sclerosis.  No abnormal foci of restricted diffusion identified to suggest acute intracranial infarct. Gray-white matter differentiation maintained. Normal flow voids seen within the intracranial vasculature. No intracranial hemorrhage.  No mass lesion or midline shift. Ventricles are normal in size without evidence of hydrocephalus. No extra-axial fluid collection.  Incidental note made of a small 6 mm nonenhancing T1 hypointense, T2 hyperintense cyst within the pineal gland. Pituitary gland unremarkable.  Craniocervical junction within normal limits. Visualized upper cervical spine is unremarkable.  Visualized bone marrow is within normal limits. Scalp soft tissues are unremarkable.  Paranasal sinuses are clear. No mastoid effusion. Inner ear structures are within normal limits.  MRI ORBITS FINDINGS  There is asymmetric enhancement within the pre chiasmatic left optic nerve near the left orbital apex (series 20, image 18). The left optic nerve is slightly swollen in appearance at this level as well. The optic chiasm itself is within normal limits. No definite abnormal enhancement seen within the right optic nerve. Optic chiasm is  normal. Globes themselves are within normal limits. Extraocular muscles are normal. Intraconal and extraconal fat is within normal limits.  IMPRESSION: 1. Multiple T2/FLAIR hyperintense lesions involving the supratentorial white matter as above, highly concerning for multiple sclerosis. 5 mm enhancing lesion within the left occipital lobe most consistent with active demyelination. No other enhancing lesions identified within the brain. 2. Asymmetric enhancement within the pre chiasmatic left optic nerve, suspicious for active optic neuritis.   Electronically Signed   By: Rise Mu M.D.   On: 07/28/2014 00:02   Mr Birdie Hopes Wo/w Cm  07/28/2014   CLINICAL DATA:  Initial evaluation for acute vision loss and left eye. Concern for demyelinating disease.  EXAM: MRI HEAD AND ORBITS WITHOUT AND WITH CONTRAST  TECHNIQUE: Multiplanar, multiecho pulse sequences of the brain and surrounding structures were obtained without and with intravenous contrast. Multiplanar, multiecho pulse sequences of the orbits and surrounding structures were obtained including fat saturation techniques, before and after intravenous contrast administration.  CONTRAST:  20mL MULTIHANCE GADOBENATE DIMEGLUMINE 529 MG/ML IV SOLN  COMPARISON:  None available.  FINDINGS: MRI HEAD FINDINGS  Multiple patchy T2/FLAIR hyperintensities are seen involving the white matter of both cerebral hemispheres. Specifically, these involve the periventricular, deep, and juxta cortical white matter. Several of these lesions are oriented perpendicular to the lateral ventricles, most evident on axial FLAIR sequence (series 6, image 19). The most prominent lesion is a juxta cortical lesion within the anterior left frontal lobe which measures 1.4 cm (series  6, image 21). No infratentorial lesions identified. A single 5 mm lesion within the left occipital cortex demonstrates post-contrast enhancement, suggesting active demyelination (series 17, image 34). No other  enhancing lesions identified. Findings are highly suspicious for multiple sclerosis.  No abnormal foci of restricted diffusion identified to suggest acute intracranial infarct. Gray-white matter differentiation maintained. Normal flow voids seen within the intracranial vasculature. No intracranial hemorrhage.  No mass lesion or midline shift. Ventricles are normal in size without evidence of hydrocephalus. No extra-axial fluid collection.  Incidental note made of a small 6 mm nonenhancing T1 hypointense, T2 hyperintense cyst within the pineal gland. Pituitary gland unremarkable.  Craniocervical junction within normal limits. Visualized upper cervical spine is unremarkable.  Visualized bone marrow is within normal limits. Scalp soft tissues are unremarkable.  Paranasal sinuses are clear. No mastoid effusion. Inner ear structures are within normal limits.  MRI ORBITS FINDINGS  There is asymmetric enhancement within the pre chiasmatic left optic nerve near the left orbital apex (series 20, image 18). The left optic nerve is slightly swollen in appearance at this level as well. The optic chiasm itself is within normal limits. No definite abnormal enhancement seen within the right optic nerve. Optic chiasm is normal. Globes themselves are within normal limits. Extraocular muscles are normal. Intraconal and extraconal fat is within normal limits.  IMPRESSION: 1. Multiple T2/FLAIR hyperintense lesions involving the supratentorial white matter as above, highly concerning for multiple sclerosis. 5 mm enhancing lesion within the left occipital lobe most consistent with active demyelination. No other enhancing lesions identified within the brain. 2. Asymmetric enhancement within the pre chiasmatic left optic nerve, suspicious for active optic neuritis.   Electronically Signed   By: Rise MuBenjamin  McClintock M.D.   On: 07/28/2014 00:02     EKG Interpretation None      MDM   Final diagnoses:  Optic neuritis    Patient  presents with vision loss of the last 2-3 days. History and physical exam concerning for optic neuritis. Was sent here for MRI. MRI shows multiple lesions concerning for multiple sclerosis. She also has evidence of active optic neuritis. Neurology was consulted.  Patient will be admitted for high-dose antibiotics. Labs performed at outside hospital reviewed and CBC and BMP within normal limits. Cost was consulted for admission.    Shon Batonourtney F Alice Vitelli, MD 07/28/14 [phone]

## 2014-07-29 LAB — ANA: Anti Nuclear Antibody(ANA): NEGATIVE

## 2014-09-15 ENCOUNTER — Encounter: Payer: Self-pay | Admitting: Neurology

## 2014-09-15 ENCOUNTER — Ambulatory Visit (INDEPENDENT_AMBULATORY_CARE_PROVIDER_SITE_OTHER): Payer: BLUE CROSS/BLUE SHIELD | Admitting: Neurology

## 2014-09-15 ENCOUNTER — Other Ambulatory Visit: Payer: Self-pay | Admitting: Neurology

## 2014-09-15 VITALS — BP 104/70 | HR 90 | Temp 98.2°F | Resp 18 | Ht 69.0 in | Wt 222.2 lb

## 2014-09-15 DIAGNOSIS — G35 Multiple sclerosis: Secondary | ICD-10-CM

## 2014-09-15 LAB — HEPATIC FUNCTION PANEL
ALBUMIN: 4 g/dL (ref 3.5–5.2)
ALK PHOS: 64 U/L (ref 39–117)
ALT: 23 U/L (ref 0–35)
AST: 29 U/L (ref 0–37)
Bilirubin, Direct: 0.1 mg/dL (ref 0.0–0.3)
Indirect Bilirubin: 0.3 mg/dL (ref 0.2–1.2)
TOTAL PROTEIN: 6.8 g/dL (ref 6.0–8.3)
Total Bilirubin: 0.4 mg/dL (ref 0.2–1.2)

## 2014-09-15 LAB — CBC WITH DIFFERENTIAL/PLATELET
BASOS PCT: 0 % (ref 0–1)
Basophils Absolute: 0 10*3/uL (ref 0.0–0.1)
Eosinophils Absolute: 0.1 10*3/uL (ref 0.0–0.7)
Eosinophils Relative: 1 % (ref 0–5)
HEMATOCRIT: 38 % (ref 36.0–46.0)
Hemoglobin: 12.7 g/dL (ref 12.0–15.0)
Lymphocytes Relative: 29 % (ref 12–46)
Lymphs Abs: 2.2 10*3/uL (ref 0.7–4.0)
MCH: 27.9 pg (ref 26.0–34.0)
MCHC: 33.4 g/dL (ref 30.0–36.0)
MCV: 83.3 fL (ref 78.0–100.0)
MPV: 10.8 fL (ref 8.6–12.4)
Monocytes Absolute: 0.4 10*3/uL (ref 0.1–1.0)
Monocytes Relative: 5 % (ref 3–12)
NEUTROS ABS: 4.9 10*3/uL (ref 1.7–7.7)
Neutrophils Relative %: 65 % (ref 43–77)
Platelets: 308 10*3/uL (ref 150–400)
RBC: 4.56 MIL/uL (ref 3.87–5.11)
RDW: 14.6 % (ref 11.5–15.5)
WBC: 7.6 10*3/uL (ref 4.0–10.5)

## 2014-09-15 NOTE — Patient Instructions (Addendum)
1.  We will get MRI of the cervical spine with and without contrast  [facility]  10/01/14 12:45pm  2.  We will check vitamin D level, CBC with diff, liver function tests, and JC Virus 3.  Review information on the various medications, including Tysabri 4.  Follow up 3 months after starting the medication.  I would like to repeat MRI of the brain 3 months after starting medication before following up with me.

## 2014-09-15 NOTE — Progress Notes (Signed)
NEUROLOGY CONSULTATION NOTE  Chaunta Bejarano MRN: [id number] DOB: September 15, 1990  Referring provider: Thad Ranger, MD  Primary care provider: none  Reason for consult:  MS  HISTORY OF PRESENT ILLNESS: Beth Santos is a 24 year old right-handed woman with no significant past medical history who presents for newly-diagnosed multiple sclerosis.  Records, labs and images personally reviewed.  She is accompanied by her mother who provides some history.  Records, labs and MRI of brain reviewed.  She was admitted to [facility] on 07/27/14 for acute onset of vision loss in the left eye.  She initially woke up with dimming of vision in her left eye, which progressed to white out of her vision in the left eye.  It was not painful. She had an MRI of the brain with and without contrast, which showed multiple hyperdense non-enhancing lesions in the supratentorial white matter with 5 mm enhancing lesion in the left occipital lobe.  There was associated asymmetric enhancement of the left optic nerve.  She declined MRI of cervical spine.  Labs showed ANA negative, Sed Rate 18, RPR non-reactive, TSH 6.060, free T4 1, HIV 1&2 antibodies non-reactive, and unremarkable CBC with diff.  She was treated with Solumedrol  for 5 days.  Vision improved.  She also reports history numbness in the left leg off and on for some time.  She also will sometimes experience tingling in the right foot.  She notes cold may exacerbate symptoms.  She reports feeling tired but no significant fatigue.  She occasionally notes twitching in her left temple.  She denies muscle cramps or gait instability.  She does not have family history of MS.  PAST MEDICAL HISTORY: Past Medical History  Diagnosis Date  . Multiple sclerosis     PAST SURGICAL HISTORY: Past Surgical History  Procedure Laterality Date  . Appendectomy      MEDICATIONS: Current Outpatient Prescriptions on File Prior to Visit  Medication Sig Dispense Refill  .  methylPREDNISolone sodium succinate 1,000 mg in sodium chloride 0.9 % 50 mL Inject 1,000 mg into the vein daily. X 4 more days 4 packet 0  . norgestimate-ethinyl estradiol (SPRINTEC 28) 0.25-35 MG-MCG tablet Take 1 tablet by mouth daily.    . pantoprazole (PROTONIX) 40 MG tablet Take 1 tablet (40 mg total) by mouth daily. 30 tablet 0   No current facility-administered medications on file prior to visit.    ALLERGIES: Allergies  Allergen Reactions  . Oxycodone-Acetaminophen Nausea Only    FAMILY HISTORY: Family History  Problem Relation Age of Onset  . Diabetes Father   . Rheum arthritis Mother   . Cancer Maternal Grandmother     renal cell   . Diabetes Maternal Grandfather   . Hypertension Maternal Grandfather   . Alzheimer's disease Maternal Grandfather   . Cancer Paternal Grandmother     lung    SOCIAL HISTORY: History   Social History  . Marital Status: Single    Spouse Name: N/A    Number of Children: N/A  . Years of Education: N/A   Occupational History  . Not on file.   Social History Main Topics  . Smoking status: Former Smoker -- 0.50 packs/day    Quit date: 04/27/2013  . Smokeless tobacco: Never Used  . Alcohol Use: 0.0 oz/week    0 Not specified per week     Comment: wine/beer  . Drug Use: No  . Sexual Activity:    Partners: Male    Pharmacist, hospital Protection: None  Other Topics Concern  . Not on file   Social History Narrative    REVIEW OF SYSTEMS: Constitutional: No fevers, chills, or sweats, no generalized fatigue, change in appetite Eyes: No visual changes, double vision, eye pain Ear, nose and throat: No hearing loss, ear pain, nasal congestion, sore throat Cardiovascular: No chest pain, palpitations Respiratory:  No shortness of breath at rest or with exertion, wheezes GastrointestinaI: No nausea, vomiting, diarrhea, abdominal pain, fecal incontinence Genitourinary:  No dysuria, urinary retention or frequency Musculoskeletal:  No neck  pain, back pain Integumentary: No rash, pruritus, skin lesions Neurological: as above Psychiatric: No depression, insomnia, anxiety Endocrine: No palpitations, fatigue, diaphoresis, mood swings, change in appetite, change in weight, increased thirst Hematologic/Lymphatic:  No anemia, purpura, petechiae. Allergic/Immunologic: no itchy/runny eyes, nasal congestion, recent allergic reactions, rashes  PHYSICAL EXAM: Filed Vitals:   09/15/14 0823  BP: 104/70  Pulse: 90  Temp: 98.2 F (36.8 C)  Resp: 18   General: No acute distress Head:  Normocephalic/atraumatic Eyes:  fundi unremarkable, without vessel changes, exudates, hemorrhages or papilledema. Neck: supple, no paraspinal tenderness, full range of motion Back: No paraspinal tenderness Heart: regular rate and rhythm Lungs: Clear to auscultation bilaterally. Vascular: No carotid bruits. Neurological Exam: Mental status: alert and oriented to person, place, and time, recent and remote memory intact, fund of knowledge intact, attention and concentration intact, speech fluent and not dysarthric, language intact. Cranial nerves: CN I: not tested CN II: pupils equal, round and reactive to light, visual fields intact, vision 20/20 bilaterally corrected with glasses, fundi unremarkable, without vessel changes, exudates, hemorrhages or papilledema. CN III, IV, VI:  full range of motion, no nystagmus, no ptosis CN V: facial sensation intact CN VII: upper and lower face symmetric CN VIII: hearing intact CN IX, X: gag intact, uvula midline CN XI: sternocleidomastoid and trapezius muscles intact CN XII: tongue midline Bulk & Tone: normal, no fasciculations. Motor:  5/5 throughout Sensation:  Reduced pinprick sensation in left foot and ankle and reduced vibration in the left foot. Deep Tendon Reflexes:  2+ throughout, toes downgoing. Finger to nose testing:  No dysmetria Heel to shin:  No dysmetria Gait:  Normal station and stride.   Reduced left arm-swing.  Able to turn, walk on toes, heels and in tandem. Romberg negative.  IMPRESSION: Newly-diagnosed relapsing remitting MS  PLAN: 1.  We will get MRI of the cervical spine with and without contrast  [facility]  10/01/14 12:45pm  2.  We will check vitamin D level, CBC with diff, liver function tests, and JC Virus 3.  We discussed at length the available disease modifying agents, including the risk on pregnancy and of PML in the oral agents and in particular Tysabri.  She is interested in [place].  Provided information on the medications and Tysabri to review. 4.  Follow up 3 months after starting the medication.  I would like to repeat MRI of the brain 3 months after starting medication before following up with me  Thank you for allowing me to take part in the care of this patient.  Shon Millet, DO

## 2014-09-16 ENCOUNTER — Telehealth: Payer: Self-pay | Admitting: *Deleted

## 2014-09-16 LAB — VITAMIN D 25 HYDROXY (VIT D DEFICIENCY, FRACTURES): VIT D 25 HYDROXY: 14 ng/mL — AB (ref 30–100)

## 2014-09-16 NOTE — Telephone Encounter (Signed)
Left message for patient to contact office regarding labs.

## 2014-09-16 NOTE — Telephone Encounter (Signed)
-----   Message from Cira Servant, DO sent at 09/16/2014  7:01 AM EST ----- White blood cell count and liver function look okay.  Vitamin D level is low (14).  I would like it above 50.  Therefore I recommend starting D3 4000 units daily (can get over the counter).  The test for JC virus is still pending. ----- Message -----    From: Lab in [facility] Interface    Sent: 09/16/2014   2:16 AM      To: Cira Servant, DO

## 2014-09-17 LAB — STRATIFY JCV ANTIBODY ELISA W/RFLX TO INHIBITION ASSAY

## 2014-09-18 LAB — STRATIFY JCV AB INHIBITION (REFLEX)

## 2014-09-21 ENCOUNTER — Other Ambulatory Visit: Payer: Self-pay | Admitting: *Deleted

## 2014-09-21 ENCOUNTER — Telehealth: Payer: Self-pay | Admitting: *Deleted

## 2014-09-21 DIAGNOSIS — G35 Multiple sclerosis: Secondary | ICD-10-CM

## 2014-09-21 LAB — PREGNANCY, URINE: Preg Test, Ur: NEGATIVE

## 2014-09-21 NOTE — Telephone Encounter (Signed)
Patient is aware of labs she will have HCG test done today  labs slip is at front desk

## 2014-09-21 NOTE — Telephone Encounter (Signed)
-----   Message from Cira Servant, DO sent at 09/20/2014  6:37 AM EST ----- Beth Santos is negative for the JC Virus.  This test should be repeated 3 months after starting Tysabri (prior to following up with me) ----- Message -----    From: Lab in [facility] Interface    Sent: 09/18/2014  10:24 AM      To: Cira Servant, DO

## 2014-10-01 ENCOUNTER — Ambulatory Visit (HOSPITAL_COMMUNITY)
Admission: RE | Admit: 2014-10-01 | Discharge: 2014-10-01 | Disposition: A | Discharge status: Home / Self Care | Payer: BLUE CROSS/BLUE SHIELD | Source: Ambulatory Visit | Attending: Neurology | Admitting: Neurology

## 2014-10-01 DIAGNOSIS — G35 Multiple sclerosis: Secondary | ICD-10-CM | POA: Diagnosis not present

## 2014-10-01 MED ORDER — GADOBENATE DIMEGLUMINE 529 MG/ML IV SOLN
20.0000 mL | Freq: Once | INTRAVENOUS | Status: AC | PRN
  Administered 2014-10-01: 20 mL via INTRAVENOUS

## 2014-10-04 ENCOUNTER — Telehealth: Payer: Self-pay | Admitting: *Deleted

## 2014-10-04 NOTE — Telephone Encounter (Signed)
pateint is aware MRI of the cervical spine does show subtle small signal change which may be related to MS.  It is not active.

## 2014-10-04 NOTE — Telephone Encounter (Signed)
-----   Message from Cira Servant, DO sent at 10/03/2014  3:00 PM EST ----- MRI of the cervical spine does show subtle small signal change which may be related to MS.  It is not active. ----- Message -----    From: Rad Results In Interface    Sent: 10/01/2014   3:39 PM      To: Cira Servant, DO

## 2014-10-04 NOTE — Telephone Encounter (Signed)
-----   Message from Adam Robert Jaffe, DO sent at 10/03/2014  3:00 PM EST ----- MRI of the cervical spine does show subtle small signal change which may be related to MS.  It is not active. ----- Message -----    From: Rad Results In Interface    Sent: 10/01/2014   3:39 PM      To: Adam Robert Jaffe, DO    

## 2014-10-15 ENCOUNTER — Telehealth: Payer: Self-pay | Admitting: *Deleted

## 2014-10-15 NOTE — Telephone Encounter (Signed)
Patient is waiting on finical assistance from [employer]  to start MS infusion they have chosen [facility] as the site

## 2014-10-20 ENCOUNTER — Telehealth: Payer: Self-pay | Admitting: Neurology

## 2014-10-20 NOTE — Telephone Encounter (Signed)
Patient  Is calling to let Dr Everlena Cooper know that her insurance has

## 2014-10-20 NOTE — Telephone Encounter (Signed)
Pt called wanting to speak to a nurse to set up an appt to have an injection for her MS. C/b [phone]

## 2014-10-21 ENCOUNTER — Other Ambulatory Visit: Payer: Self-pay | Admitting: *Deleted

## 2014-10-21 ENCOUNTER — Telehealth: Payer: Self-pay | Admitting: *Deleted

## 2014-10-21 NOTE — Telephone Encounter (Signed)
Patient is aware she will start Tysabri 11/22/14 at [facility] s/s  at 9:30am  orders are in system and checked per [facility] at [employer] .

## 2014-11-22 ENCOUNTER — Other Ambulatory Visit (HOSPITAL_COMMUNITY): Payer: Self-pay | Admitting: Neurology

## 2014-11-22 ENCOUNTER — Encounter (HOSPITAL_COMMUNITY): Payer: Self-pay

## 2014-11-22 ENCOUNTER — Encounter (HOSPITAL_COMMUNITY)
Admission: RE | Admit: 2014-11-22 | Discharge: 2014-11-22 | Disposition: A | Discharge status: Home / Self Care | Payer: BLUE CROSS/BLUE SHIELD | Source: Ambulatory Visit | Attending: Neurology | Admitting: Neurology

## 2014-11-22 DIAGNOSIS — G35 Multiple sclerosis: Secondary | ICD-10-CM | POA: Diagnosis not present

## 2014-11-22 MED ORDER — SODIUM CHLORIDE 0.9 % IV SOLN
300.0000 mg | INTRAVENOUS | Status: DC
  Administered 2014-11-22: 300 mg via INTRAVENOUS
  Filled 2014-11-22: qty 15

## 2014-11-22 MED ORDER — DIPHENHYDRAMINE HCL 25 MG PO TABS
25.0000 mg | ORAL_TABLET | ORAL | Status: DC
  Administered 2014-11-22: 25 mg via ORAL
  Filled 2014-11-22 (×2): qty 1

## 2014-11-22 MED ORDER — ACETAMINOPHEN 325 MG PO TABS
650.0000 mg | ORAL_TABLET | Freq: Four times a day (QID) | ORAL | Status: DC | PRN
  Administered 2014-11-22: 650 mg via ORAL
  Filled 2014-11-22: qty 2

## 2014-11-22 MED ORDER — SODIUM CHLORIDE 0.9 % IV SOLN
INTRAVENOUS | Status: DC
  Administered 2014-11-22: 12:00:00 via INTRAVENOUS

## 2014-11-22 NOTE — Progress Notes (Signed)
First infusion of Tysabri tolerated well.  Pt stayed for her 1 hour post infusion observation.  Pt d/c home ambulatory.

## 2014-11-22 NOTE — Progress Notes (Signed)
IV team in room attempting IV site.

## 2014-11-22 NOTE — Progress Notes (Signed)
[name] attempted to start iv x2 in rt arm, both unsuccessful.  Pt states she is a hard stick and was stuck 9 times in [facility] hospital recently.  Consulted IV team for this reason and because we were unsuccessful x2.  Pt was informed at this time that the IV team had been called and this may delay the start of the infusion.  Pt voiced understanding.

## 2014-11-22 NOTE — Progress Notes (Signed)
Pt arrived early for her appointment today.  Pt thought it was for 0900, but the appointment is for 1000 today.  Advised pt she could get some food if she liked or she could have a seat in her room and wait.  Advised pt we would not be able to get to her early, and it would be closer to her appointment time.  Pt voiced understanding and states she will just sit and wait.

## 2014-11-22 NOTE — Progress Notes (Signed)
Iv team called and states they will arrive in about .to start iv.  Pt was informed of this.

## 2014-11-22 NOTE — Discharge Instructions (Signed)

## 2014-12-22 ENCOUNTER — Encounter (HOSPITAL_COMMUNITY)
Admission: RE | Admit: 2014-12-22 | Discharge: 2014-12-22 | Disposition: A | Discharge status: Home / Self Care | Payer: BLUE CROSS/BLUE SHIELD | Source: Ambulatory Visit | Attending: Neurology | Admitting: Neurology

## 2014-12-22 ENCOUNTER — Encounter (HOSPITAL_COMMUNITY): Payer: Self-pay

## 2014-12-22 DIAGNOSIS — G35 Multiple sclerosis: Secondary | ICD-10-CM | POA: Insufficient documentation

## 2014-12-22 MED ORDER — SODIUM CHLORIDE 0.9 % IV SOLN
INTRAVENOUS | Status: DC
  Administered 2014-12-22: 11:00:00 via INTRAVENOUS

## 2014-12-22 MED ORDER — SODIUM CHLORIDE 0.9 % IV SOLN
300.0000 mg | INTRAVENOUS | Status: DC
  Administered 2014-12-22: 300 mg via INTRAVENOUS
  Filled 2014-12-22: qty 15

## 2014-12-22 MED ORDER — ACETAMINOPHEN 325 MG PO TABS
650.0000 mg | ORAL_TABLET | ORAL | Status: DC
  Administered 2014-12-22: 650 mg via ORAL
  Filled 2014-12-22: qty 2

## 2014-12-22 MED ORDER — DIPHENHYDRAMINE HCL 25 MG PO CAPS
25.0000 mg | ORAL_CAPSULE | ORAL | Status: DC
  Administered 2014-12-22: 25 mg via ORAL
  Filled 2014-12-22 (×2): qty 1

## 2014-12-22 NOTE — Progress Notes (Signed)
Placed order for IV team to start Iv site.  Pt is a hard stick and iv team had to start last time she was here.  Pt informed that iv team was called and we are waiting on them to start her iv, pt voiced understanding.

## 2015-01-12 ENCOUNTER — Other Ambulatory Visit: Payer: Self-pay | Admitting: *Deleted

## 2015-01-14 ENCOUNTER — Other Ambulatory Visit: Payer: Self-pay | Admitting: *Deleted

## 2015-01-19 ENCOUNTER — Encounter (HOSPITAL_COMMUNITY): Payer: BLUE CROSS/BLUE SHIELD

## 2015-01-20 ENCOUNTER — Other Ambulatory Visit (HOSPITAL_COMMUNITY): Payer: Self-pay | Admitting: Neurology

## 2015-01-20 ENCOUNTER — Encounter (HOSPITAL_COMMUNITY): Payer: Self-pay

## 2015-01-20 ENCOUNTER — Encounter (HOSPITAL_COMMUNITY)
Admission: RE | Admit: 2015-01-20 | Discharge: 2015-01-20 | Disposition: A | Discharge status: Home / Self Care | Payer: BLUE CROSS/BLUE SHIELD | Source: Ambulatory Visit | Attending: Neurology | Admitting: Neurology

## 2015-01-20 DIAGNOSIS — G35 Multiple sclerosis: Secondary | ICD-10-CM | POA: Insufficient documentation

## 2015-01-20 MED ORDER — SODIUM CHLORIDE 0.9 % IV SOLN
INTRAVENOUS | Status: DC
  Administered 2015-01-20: 09:00:00 via INTRAVENOUS

## 2015-01-20 MED ORDER — ACETAMINOPHEN 325 MG PO TABS
650.0000 mg | ORAL_TABLET | Freq: Four times a day (QID) | ORAL | Status: DC | PRN
  Administered 2015-01-20: 650 mg via ORAL
  Filled 2015-01-20: qty 2

## 2015-01-20 MED ORDER — SODIUM CHLORIDE 0.9 % IV SOLN
300.0000 mg | INTRAVENOUS | Status: DC
  Administered 2015-01-20: 300 mg via INTRAVENOUS
  Filled 2015-01-20: qty 15

## 2015-01-20 MED ORDER — DIPHENHYDRAMINE HCL 25 MG PO CAPS
25.0000 mg | ORAL_CAPSULE | Freq: Once | ORAL | Status: AC
  Administered 2015-01-20: 25 mg via ORAL
  Filled 2015-01-20: qty 1

## 2015-02-15 ENCOUNTER — Telehealth: Payer: Self-pay | Admitting: *Deleted

## 2015-02-15 ENCOUNTER — Ambulatory Visit (INDEPENDENT_AMBULATORY_CARE_PROVIDER_SITE_OTHER): Payer: BLUE CROSS/BLUE SHIELD | Admitting: Neurology

## 2015-02-15 ENCOUNTER — Encounter: Payer: Self-pay | Admitting: Neurology

## 2015-02-15 ENCOUNTER — Other Ambulatory Visit (INDEPENDENT_AMBULATORY_CARE_PROVIDER_SITE_OTHER): Payer: BLUE CROSS/BLUE SHIELD

## 2015-02-15 VITALS — BP 110/78 | HR 80 | Wt 191.4 lb

## 2015-02-15 DIAGNOSIS — G35 Multiple sclerosis: Secondary | ICD-10-CM

## 2015-02-15 LAB — CBC WITH DIFFERENTIAL/PLATELET
BASOS ABS: 0.1 10*3/uL (ref 0.0–0.1)
Basophils Relative: 0.8 % (ref 0.0–3.0)
EOS ABS: 0.2 10*3/uL (ref 0.0–0.7)
Eosinophils Relative: 2.4 % (ref 0.0–5.0)
HEMATOCRIT: 37.2 % (ref 36.0–46.0)
Hemoglobin: 12.3 g/dL (ref 12.0–15.0)
Lymphocytes Relative: 53.2 % — ABNORMAL HIGH (ref 12.0–46.0)
Lymphs Abs: 3.9 10*3/uL (ref 0.7–4.0)
MCHC: 33 g/dL (ref 30.0–36.0)
MCV: 86.4 fl (ref 78.0–100.0)
Monocytes Absolute: 0.5 10*3/uL (ref 0.1–1.0)
Monocytes Relative: 7.4 % (ref 3.0–12.0)
Neutro Abs: 2.7 10*3/uL (ref 1.4–7.7)
Neutrophils Relative %: 36.2 % — ABNORMAL LOW (ref 43.0–77.0)
Platelets: 271 10*3/uL (ref 150.0–400.0)
RBC: 4.31 Mil/uL (ref 3.87–5.11)
RDW: 14.6 % (ref 11.5–15.5)
WBC: 7.4 10*3/uL (ref 4.0–10.5)

## 2015-02-15 LAB — COMPREHENSIVE METABOLIC PANEL
ALT: 21 U/L (ref 0–35)
AST: 22 U/L (ref 0–37)
Albumin: 3.9 g/dL (ref 3.5–5.2)
Alkaline Phosphatase: 61 U/L (ref 39–117)
BUN: 14 mg/dL (ref 6–23)
CALCIUM: 9.3 mg/dL (ref 8.4–10.5)
CO2: 26 mEq/L (ref 19–32)
Chloride: 106 mEq/L (ref 96–112)
Creatinine, Ser: 0.93 mg/dL (ref 0.40–1.20)
GFR: 77.66 mL/min (ref >60.00)
Glucose, Bld: 84 mg/dL (ref 70–99)
POTASSIUM: 3.5 meq/L (ref 3.5–5.1)
Sodium: 138 mEq/L (ref 135–145)
TOTAL PROTEIN: 6.9 g/dL (ref 6.0–8.3)
Total Bilirubin: 0.4 mg/dL (ref 0.2–1.2)

## 2015-02-15 NOTE — Telephone Encounter (Signed)
Patient called on call service this morning c/o sudden numbness on left side of body.  The triage nurse instructed her to call 911 or go to ER but patient declined.  I called patient back to see how she was doing and she is still having weakness and numbness on the left side of her body and ringing in her ears.  I spoke with Dr. Everlena Cooper since she has not had a follow up appt since starting her infusions.  He requested for her to come in at noon today.  Patient agreed.

## 2015-02-15 NOTE — Patient Instructions (Addendum)
1.  We will get MRI of brain and cervical spine with and without contrast 2.  Check CBC with differential and CMP.  Repeat in 3 months. 3.  Follow up in 3 months after repeat labs 4.  Take vitamin D3 4000 units daily

## 2015-02-15 NOTE — Progress Notes (Signed)
NEUROLOGY FOLLOW UP OFFICE NOTE  Heidee Audi [id number]  HISTORY OF PRESENT ILLNESS: Beth Santos is a 24 year old right-handed woman with no significant past medical history who follows up for relapsing-remitting MS.  MRI of cervical spine and labs reviewed.  She is accompanied by her husband who provides some history.  UPDATE: She started Tysabri in March.  She is scheduled for her fourth dose on Thursday.  Immediately after infusion, she feels fatigued.  She also notes tinnitus.  This morning, she developed sudden onset of numbness involving the entire left arm and hand.  The numbness in her left leg, which is fairly chronic, seemed worse as well.  She had trouble holding the coffee mug in her left hand.  Labs from 09/15/14 showed JCV antibody negative, normal CBC with WBC of 7.6 and absolute lymph of 2.2, normal LFTs, and D level of 14.  She was advised to start D3 4000 units daily.  Urine pregnancy test was negative.  MRI of the cervical spine with and without contrast performed on 10/01/14 showed subtle T2 hyperintense lesions at the level of C3-4 and C5, without enhancement.  HISTORY: On 07/27/14, she had left optic neuritis.  She had an MRI of the brain with and without contrast, which showed multiple hyperdense non-enhancing lesions in the supratentorial white matter with 5 mm enhancing lesion in the left occipital lobe.  There was associated asymmetric enhancement of the left optic nerve.  Labs showed ANA negative, Sed Rate 18, RPR non-reactive, TSH 6.060, free T4 1, HIV 1&2 antibodies non-reactive, and unremarkable CBC with diff.  She was treated with Solumedrol  for 5 days.  Vision improved.  She has a history numbness in the left leg off and on for some time.  She also will sometimes experience tingling in the right foot.  She notes cold may exacerbate symptoms.  She reports feeling tired but no significant fatigue.  She occasionally notes twitching in her left temple.  She  denies muscle cramps or gait instability.  She does not have family history of MS.  PAST MEDICAL HISTORY: Past Medical History  Diagnosis Date  . Multiple sclerosis     MEDICATIONS: Current Outpatient Prescriptions on File Prior to Visit  Medication Sig Dispense Refill  . methylPREDNISolone sodium succinate 1,000 mg in sodium chloride 0.9 % 50 mL Inject 1,000 mg into the vein daily. X 4 more days 4 packet 0  . Natalizumab (TYSABRI IV) Inject 300 mg into the vein every 28 (twenty-eight) days.    . norgestimate-ethinyl estradiol (ORTHO-CYCLEN,SPRINTEC,PREVIFEM) 0.25-35 MG-MCG tablet Take by mouth.    . norgestimate-ethinyl estradiol (SPRINTEC 28) 0.25-35 MG-MCG tablet Take 1 tablet by mouth daily.    . pantoprazole (PROTONIX) 40 MG tablet Take 1 tablet (40 mg total) by mouth daily. 30 tablet 0  . phentermine 37.5 MG capsule Take 37.5 mg by mouth every morning.    . topiramate (TOPAMAX) 50 MG tablet Take 50 mg by mouth every morning.     No current facility-administered medications on file prior to visit.    ALLERGIES: Allergies  Allergen Reactions  . Oxycodone-Acetaminophen Nausea Only    FAMILY HISTORY: Family History  Problem Relation Age of Onset  . Diabetes Father   . Rheum arthritis Mother   . Cancer Maternal Grandmother     renal cell   . Diabetes Maternal Grandfather   . Hypertension Maternal Grandfather   . Alzheimer's disease Maternal Grandfather   . Cancer Paternal Grandmother  lung    SOCIAL HISTORY: History   Social History  . Marital Status: Married    Spouse Name: N/A  . Number of Children: N/A  . Years of Education: N/A   Occupational History  . Not on file.   Social History Main Topics  . Smoking status: Former Smoker -- 0.50 packs/day    Quit date: 04/27/2013  . Smokeless tobacco: Never Used  . Alcohol Use: 0.0 oz/week    0 Standard drinks or equivalent per week     Comment: wine/beer  . Drug Use: No  . Sexual Activity:    Partners:  Male    Birth Control/ Protection: None   Other Topics Concern  . Not on file   Social History Narrative    REVIEW OF SYSTEMS: Constitutional: No fevers, chills, or sweats, no generalized fatigue, change in appetite Eyes: No visual changes, double vision, eye pain Ear, nose and throat: No hearing loss, ear pain, nasal congestion, sore throat Cardiovascular: No chest pain, palpitations Respiratory:  No shortness of breath at rest or with exertion, wheezes GastrointestinaI: No nausea, vomiting, diarrhea, abdominal pain, fecal incontinence Genitourinary:  No dysuria, urinary retention or frequency Musculoskeletal:  No neck pain, back pain Integumentary: No rash, pruritus, skin lesions Neurological: as above Psychiatric: No depression, insomnia, anxiety Endocrine: No palpitations, fatigue, diaphoresis, mood swings, change in appetite, change in weight, increased thirst Hematologic/Lymphatic:  No anemia, purpura, petechiae. Allergic/Immunologic: no itchy/runny eyes, nasal congestion, recent allergic reactions, rashes  PHYSICAL EXAM: Filed Vitals:   02/15/15 1206  BP: 110/78  Pulse: 80   General: No acute distress Head:  Normocephalic/atraumatic Eyes:  Fundoscopic exam unremarkable without vessel changes, exudates, hemorrhages or papilledema. Neck: supple, no paraspinal tenderness, full range of motion Heart:  Regular rate and rhythm Lungs:  Clear to auscultation bilaterally Back: No paraspinal tenderness Neurological Exam: alert and oriented to person, place, and time. Attention span and concentration intact, recent and remote memory intact, fund of knowledge intact.  Speech fluent and not dysarthric, language intact.  Bilateral facial sensory loss, otherwise CN II-XII intact. Fundoscopic exam unremarkable without vessel changes, exudates, hemorrhages or papilledema.  Bulk and tone normal, muscle strength maybe 5-/5 left triceps (some degree of reduced effort), otherwise 5/5  throughout.  Decreased pinprick in left upper and lower extremities and right hand, reduced vibration in left upper and lower extremities.  Deep tendon reflexes 2+ throughout, toes downgoing.  Finger to nose and heel to shin testing intact.  Gait normal with Timed 25 Foot Walk of 4.95 seconds,able to tandem walk, Romberg negative.  IMPRESSION: Relapsing-remitting MS, now with acute onset of numbness in the left upper extremity.  Unusual to have a flare-up while on Tysabri, but possible.  PLAN: 1.  Will get MRI of brain and cervical spine with and without contrast. 2.  Check CBC with diff and CMP 3.  I would prefer not giving Steroids, especially since she is on Tysabri and symptoms are limited to sensory.  So we will hold off. 4.  Repeat CBC with diff, CMP and JCV antibody in 3 months 5.  Follow up in 3 months after repeat labs.   Shon Millet, DO

## 2015-02-16 ENCOUNTER — Telehealth: Payer: Self-pay | Admitting: Neurology

## 2015-02-16 ENCOUNTER — Encounter (HOSPITAL_COMMUNITY): Payer: BLUE CROSS/BLUE SHIELD

## 2015-02-16 NOTE — Telephone Encounter (Signed)
Called pt and advised labs look okay. Pt verbalized understanding / Sherri S.

## 2015-02-16 NOTE — Telephone Encounter (Signed)
-----   Message from Griffin Dakin sent at 02/16/2015  8:12 AM EDT -----   ----- Message -----    From: Drema Dallas, DO    Sent: 02/16/2015   6:39 AM      To: Marlane Hatcher, CMA  Labs look okay

## 2015-02-17 ENCOUNTER — Telehealth: Payer: Self-pay | Admitting: *Deleted

## 2015-02-17 ENCOUNTER — Encounter (HOSPITAL_COMMUNITY)
Admission: RE | Admit: 2015-02-17 | Discharge: 2015-02-17 | Disposition: A | Discharge status: Home / Self Care | Payer: BLUE CROSS/BLUE SHIELD | Source: Ambulatory Visit | Attending: Neurology | Admitting: Neurology

## 2015-02-17 ENCOUNTER — Encounter (HOSPITAL_COMMUNITY): Payer: Self-pay

## 2015-02-17 DIAGNOSIS — G35 Multiple sclerosis: Secondary | ICD-10-CM | POA: Insufficient documentation

## 2015-02-17 MED ORDER — DIPHENHYDRAMINE HCL 25 MG PO CAPS
25.0000 mg | ORAL_CAPSULE | ORAL | Status: DC
  Administered 2015-02-17: 25 mg via ORAL
  Filled 2015-02-17: qty 1

## 2015-02-17 MED ORDER — SODIUM CHLORIDE 0.9 % IV SOLN
300.0000 mg | INTRAVENOUS | Status: DC
  Administered 2015-02-17: 300 mg via INTRAVENOUS
  Filled 2015-02-17: qty 15

## 2015-02-17 MED ORDER — SODIUM CHLORIDE 0.9 % IV SOLN
INTRAVENOUS | Status: DC
  Administered 2015-02-17: 09:00:00 via INTRAVENOUS

## 2015-02-17 MED ORDER — ACETAMINOPHEN 325 MG PO TABS
650.0000 mg | ORAL_TABLET | Freq: Four times a day (QID) | ORAL | Status: DC | PRN
  Administered 2015-02-17: 650 mg via ORAL
  Filled 2015-02-17: qty 2

## 2015-02-17 NOTE — Progress Notes (Signed)
Pt arrived ambulatory to Short Stay for her TYSABRI. States she had recently seen Dr Everlena Cooper for some left arm weakness and left leg numbness as documented in his notes. Pt is to have her TYSABRI as usual today.Denies any other changes in eyesite,balance or headaches.

## 2015-02-17 NOTE — Telephone Encounter (Signed)
Patient calling with some concerns she states that she has very small veins and would like to see about getting a port to take her treatments and also she needs a handicap sticker for after her treatment she has a long walk to get back to the car.please advise Call back number [phone]

## 2015-02-17 NOTE — Progress Notes (Signed)
Uneventful infusion of TYSABRI and 1 hour post infusion observation. Offered patient a w/c to go to lobby since she had mentioned some left leg numbness upon arrival, but patient declined. Discharged ambulatory to main lobby

## 2015-02-18 NOTE — Telephone Encounter (Signed)
Patient notified

## 2015-02-18 NOTE — Telephone Encounter (Signed)
There are a lot of risks with a port, with infection among them.  We haven't heard anything from the infusion center informing us of this difficulty.  If the infusion center contacts [place] recommending a port, then we can consider it.    As for the handicap parking sticker, there is no indication for this.

## 2015-03-03 ENCOUNTER — Ambulatory Visit (HOSPITAL_COMMUNITY)
Admission: RE | Admit: 2015-03-03 | Discharge: 2015-03-03 | Disposition: A | Discharge status: Home / Self Care | Payer: BLUE CROSS/BLUE SHIELD | Source: Ambulatory Visit | Attending: Neurology | Admitting: Neurology

## 2015-03-03 ENCOUNTER — Ambulatory Visit (HOSPITAL_COMMUNITY): Payer: BLUE CROSS/BLUE SHIELD

## 2015-03-03 DIAGNOSIS — G35 Multiple sclerosis: Secondary | ICD-10-CM | POA: Insufficient documentation

## 2015-03-03 DIAGNOSIS — R200 Anesthesia of skin: Secondary | ICD-10-CM | POA: Diagnosis not present

## 2015-03-03 MED ORDER — GADOBENATE DIMEGLUMINE 529 MG/ML IV SOLN
15.0000 mL | Freq: Once | INTRAVENOUS | Status: AC | PRN
  Administered 2015-03-03: 15 mL via INTRAVENOUS

## 2015-03-16 ENCOUNTER — Encounter (HOSPITAL_COMMUNITY): Payer: BLUE CROSS/BLUE SHIELD

## 2015-03-17 ENCOUNTER — Encounter (HOSPITAL_COMMUNITY): Payer: Self-pay

## 2015-03-17 ENCOUNTER — Encounter (HOSPITAL_COMMUNITY)
Admission: RE | Admit: 2015-03-17 | Discharge: 2015-03-17 | Disposition: A | Discharge status: Home / Self Care | Payer: BLUE CROSS/BLUE SHIELD | Source: Ambulatory Visit | Attending: Neurology | Admitting: Neurology

## 2015-03-17 DIAGNOSIS — G35 Multiple sclerosis: Secondary | ICD-10-CM | POA: Insufficient documentation

## 2015-03-17 MED ORDER — DIPHENHYDRAMINE HCL 25 MG PO CAPS
25.0000 mg | ORAL_CAPSULE | ORAL | Status: DC
  Administered 2015-03-17: 25 mg via ORAL
  Filled 2015-03-17: qty 1

## 2015-03-17 MED ORDER — ACETAMINOPHEN 325 MG PO TABS
650.0000 mg | ORAL_TABLET | Freq: Four times a day (QID) | ORAL | Status: DC | PRN
  Administered 2015-03-17: 650 mg via ORAL
  Filled 2015-03-17: qty 2

## 2015-03-17 MED ORDER — SODIUM CHLORIDE 0.9 % IV SOLN
300.0000 mg | INTRAVENOUS | Status: DC
  Filled 2015-03-17: qty 15

## 2015-03-17 MED ORDER — SODIUM CHLORIDE 0.9 % IV SOLN: INTRAVENOUS | Status: DC

## 2015-03-17 NOTE — Progress Notes (Signed)
Pt here today for Tysabri infusion.  Unable to obtain peripheral iv site, IV Team was called and they were unsuccessful even with ultrasound.  Floor nurse also unable to stick pt.  Pt was advised to return in the am at 0700 and for the rest of today she is to hydrate herself with lots of fluid, encouraged drinking water every hour.  Pt voiced understanding.  Will try peripheral iv site again in the morning and attempt to give pt her Tysabri.

## 2015-03-18 ENCOUNTER — Encounter (HOSPITAL_COMMUNITY): Payer: Self-pay

## 2015-03-18 ENCOUNTER — Telehealth: Payer: Self-pay | Admitting: *Deleted

## 2015-03-18 ENCOUNTER — Encounter (HOSPITAL_COMMUNITY)
Admission: RE | Admit: 2015-03-18 | Discharge: 2015-03-18 | Disposition: A | Discharge status: Home / Self Care | Payer: BLUE CROSS/BLUE SHIELD | Source: Ambulatory Visit | Attending: Neurology | Admitting: Neurology

## 2015-03-18 DIAGNOSIS — G35 Multiple sclerosis: Secondary | ICD-10-CM | POA: Diagnosis not present

## 2015-03-18 MED ORDER — SODIUM CHLORIDE 0.9 % IV SOLN: INTRAVENOUS | Status: DC

## 2015-03-18 MED ORDER — DIPHENHYDRAMINE HCL 25 MG PO CAPS: 25.0000 mg | ORAL_CAPSULE | ORAL | Status: DC

## 2015-03-18 MED ORDER — ACETAMINOPHEN 325 MG PO TABS: 650.0000 mg | ORAL_TABLET | Freq: Four times a day (QID) | ORAL | Status: DC | PRN

## 2015-03-18 NOTE — Progress Notes (Signed)
Pt came today after hydrating herself yesterday with fluids to attempt another peripheral iv site in order to receive her Tysabri.  Iv team attempted and was unsuccessful.  We then called the OR holding room and asked a nurse anesthetist to assess for a peripheral iv site.  She was also unable to obtain an iv site after several attempts.  Called and spoke to Danny Lawless Der Karl Ito at Dr. Moises Blood office and she was informed of todays events.  She states she is putting Beth Santos down for an appointment July 12 at 11am to talk with [facility] about a port-a-cath.  Tysabri was not infused today, and Ms.Dunphy was given the information about the July 12 appointment and told to arrive there at [facility] office at 11am to discuss the portacath.  Ms.Belford voiced understanding.

## 2015-03-18 NOTE — Telephone Encounter (Signed)
I have scheduled this patient for a visit on Tuesday they have had no luck with her Tysabri IV infusion x 2 days they are requesting a possible Porta cath .

## 2015-03-18 NOTE — Discharge Instructions (Signed)

## 2015-03-21 ENCOUNTER — Encounter (HOSPITAL_BASED_OUTPATIENT_CLINIC_OR_DEPARTMENT_OTHER): Payer: Self-pay | Admitting: *Deleted

## 2015-03-21 ENCOUNTER — Ambulatory Visit: Payer: Self-pay | Admitting: Surgery

## 2015-03-21 NOTE — H&P (Signed)
History of Present Illness Beth Santos. Beth Dolley MD; 03/21/2015 2:07 PM) Patient words: PAC removal.  The patient is a 24 year old female who presents with a complaint of Muscle weakness. Referred by Dr. Everlena Cooper - Neurology for evaluation for port This is a 24 year old female who was diagnosed last year with multiple sclerosis. She is being treated with IV infusions of Tysabri. She missed her last treatment because they were unable to establish peripheral IV access. We were called this morning to see the patient for possible port placement so that she may receive her infusions.   Other Problems Beth Santos, CMA; 03/21/2015 11:58 AM) Anxiety Disorder Depression Other disease, cancer, significant illness  Past Surgical History Beth Santos, CMA; 03/21/2015 11:58 AM) Appendectomy  Diagnostic Studies History Beth Santos, CMA; 03/21/2015 11:58 AM) Colonoscopy never Mammogram >3 years ago Pap Smear 1-5 years ago  Allergies Beth Santos, CMA; 03/21/2015 11:47 AM) Loracarbef *ANTI-INFECTIVE AGENTS - MISC.*  Medication History Beth Santos, CMA; 03/21/2015 11:48 AM) Topamax (100MG  Tablet, Oral) Active. Phentermine HCl (37.5MG  Capsule, Oral) Active. Sprintec 28 (0.25-35MG -MCG Tablet, Oral) Active.  Social History Beth Santos, CMA; 03/21/2015 11:58 AM) Alcohol use Occasional alcohol use. Caffeine use Coffee. No drug use Tobacco use Former smoker.  Family History Beth Santos, CMA; 03/21/2015 11:58 AM) Family history unknown First Degree Relatives  Pregnancy / Birth History Beth Santos, CMA; 03/21/2015 11:58 AM) Age at menarche 7 years. Contraceptive History Oral contraceptives. Gravida 0 Para 0 Regular periods  Review of Systems (Beth Santos CMA; 03/21/2015 11:58 AM) General Present- Fatigue. Not Present- Appetite Loss, Chills, Fever, Night Sweats, Weight Gain and Weight Loss. Skin Not Present- Change in Wart/Mole, Dryness, Hives, Jaundice, New Lesions, Non-Healing  Wounds, Rash and Ulcer. HEENT Present- Ringing in the Ears and Wears glasses/contact lenses. Not Present- Earache, Hearing Loss, Hoarseness, Nose Bleed, Oral Ulcers, Seasonal Allergies, Sinus Pain, Sore Throat, Visual Disturbances and Yellow Eyes. Respiratory Not Present- Bloody sputum, Chronic Cough, Difficulty Breathing, Snoring and Wheezing. Breast Not Present- Breast Mass, Breast Pain, Nipple Discharge and Skin Changes. Cardiovascular Not Present- Chest Pain, Difficulty Breathing Lying Down, Leg Cramps, Palpitations, Rapid Heart Rate, Shortness of Breath and Swelling of Extremities. Gastrointestinal Present- Constipation. Not Present- Abdominal Pain, Bloating, Bloody Stool, Change in Bowel Habits, Chronic diarrhea, Difficulty Swallowing, Excessive gas, Gets full quickly at meals, Hemorrhoids, Indigestion, Nausea, Rectal Pain and Vomiting. Female Genitourinary Not Present- Frequency, Nocturia, Painful Urination, Pelvic Pain and Urgency. Musculoskeletal Present- Joint Stiffness and Muscle Weakness. Not Present- Back Pain, Joint Pain, Muscle Pain and Swelling of Extremities. Neurological Present- Numbness, Tingling and Weakness. Not Present- Decreased Memory, Fainting, Headaches, Seizures, Tremor and Trouble walking. Psychiatric Present- Anxiety, Depression and Frequent crying. Not Present- Bipolar, Change in Sleep Pattern and Fearful. Endocrine Present- Cold Intolerance and Heat Intolerance. Not Present- Excessive Hunger, Hair Changes, Hot flashes and New Diabetes. Hematology Not Present- Easy Bruising, Excessive bleeding, Gland problems, HIV and Persistent Infections.   Vitals (Beth Santos CMA; 03/21/2015 11:47 AM) 03/21/2015 11:46 AM Weight: 190 lb Height: 69in Body Surface Area: 2.05 m Body Mass Index: 28.06 kg/m Temp.: 97.8F(Temporal)  Pulse: 72 (Regular)  BP: 128/80 (Sitting, Left Arm, Standard)    Physical Exam Beth Hazard K. Ursala Cressy MD; 03/21/2015 2:07 PM) The physical exam  findings are as follows: Note:WDWN in NAD Lungs - CTA B- Left chest - no scars CV - RRR Abd - soft, non-tender    Assessment & Plan Beth Hazard K. Amadi Yoshino MD; 03/21/2015 2:08 PM) MULTIPLE SCLEROSIS (340  G35) Current Plans  Schedule  for Surgery - Port placement. The surgical procedure has been discussed with the patient. Potential risks, benefits, alternative treatments, and expected outcomes have been explained. All of the patient's questions at this time have been answered. The likelihood of reaching the patient's treatment goal is good. The patient understand the proposed surgical procedure and wishes to proceed. Note:It is unclear when she will receive her infusion, so we will not leave the port accessed.     Beth Santos. Corliss Skains, MD, [facility]  General/ Trauma Surgery  03/21/2015 2:09 PM

## 2015-03-22 ENCOUNTER — Ambulatory Visit (HOSPITAL_COMMUNITY): Payer: BLUE CROSS/BLUE SHIELD

## 2015-03-22 ENCOUNTER — Ambulatory Visit: Payer: BLUE CROSS/BLUE SHIELD | Admitting: Neurology

## 2015-03-22 ENCOUNTER — Encounter (HOSPITAL_BASED_OUTPATIENT_CLINIC_OR_DEPARTMENT_OTHER): Payer: Self-pay

## 2015-03-22 ENCOUNTER — Ambulatory Visit (HOSPITAL_BASED_OUTPATIENT_CLINIC_OR_DEPARTMENT_OTHER): Payer: BLUE CROSS/BLUE SHIELD | Admitting: Anesthesiology

## 2015-03-22 ENCOUNTER — Ambulatory Visit (HOSPITAL_BASED_OUTPATIENT_CLINIC_OR_DEPARTMENT_OTHER)
Admission: RE | Admit: 2015-03-22 | Discharge: 2015-03-22 | Disposition: A | Discharge status: Home / Self Care | Payer: BLUE CROSS/BLUE SHIELD | Source: Ambulatory Visit | Attending: Surgery | Admitting: Surgery

## 2015-03-22 ENCOUNTER — Encounter (HOSPITAL_BASED_OUTPATIENT_CLINIC_OR_DEPARTMENT_OTHER)
Admission: RE | Disposition: A | Discharge status: Home / Self Care | Payer: Self-pay | Source: Ambulatory Visit | Attending: Surgery

## 2015-03-22 DIAGNOSIS — Z87891 Personal history of nicotine dependence: Secondary | ICD-10-CM | POA: Diagnosis not present

## 2015-03-22 DIAGNOSIS — Z793 Long term (current) use of hormonal contraceptives: Secondary | ICD-10-CM | POA: Diagnosis not present

## 2015-03-22 DIAGNOSIS — G35 Multiple sclerosis: Secondary | ICD-10-CM | POA: Diagnosis not present

## 2015-03-22 DIAGNOSIS — Z95828 Presence of other vascular implants and grafts: Secondary | ICD-10-CM

## 2015-03-22 DIAGNOSIS — Z79899 Other long term (current) drug therapy: Secondary | ICD-10-CM | POA: Diagnosis not present

## 2015-03-22 DIAGNOSIS — Z859 Personal history of malignant neoplasm, unspecified: Secondary | ICD-10-CM | POA: Diagnosis not present

## 2015-03-22 DIAGNOSIS — Z452 Encounter for adjustment and management of vascular access device: Secondary | ICD-10-CM

## 2015-03-22 HISTORY — DX: Dental restoration status: Z98.811

## 2015-03-22 HISTORY — DX: Personal history of other specified conditions: Z87.898

## 2015-03-22 HISTORY — DX: Personal history of self-harm: Z91.5

## 2015-03-22 HISTORY — DX: Personal history of suicidal behavior: Z91.51

## 2015-03-22 HISTORY — DX: Personal history of other diseases of the musculoskeletal system and connective tissue: Z87.39

## 2015-03-22 HISTORY — PX: PORTACATH PLACEMENT: SHX2246

## 2015-03-22 HISTORY — PX: OTHER SURGICAL HISTORY: SHX169

## 2015-03-22 HISTORY — DX: Other specified disorders of veins: I87.8

## 2015-03-22 LAB — POCT HEMOGLOBIN-HEMACUE: Hemoglobin: 11.7 g/dL — ABNORMAL LOW (ref 12.0–15.0)

## 2015-03-22 SURGERY — INSERTION, TUNNELED CENTRAL VENOUS DEVICE, WITH PORT
Anesthesia: General | Site: Chest

## 2015-03-22 MED ORDER — FENTANYL CITRATE (PF) 100 MCG/2ML IJ SOLN
25.0000 ug | INTRAMUSCULAR | Status: DC | PRN
  Administered 2015-03-22: 50 ug via INTRAVENOUS
  Administered 2015-03-22 (×2): 25 ug via INTRAVENOUS

## 2015-03-22 MED ORDER — HEPARIN SOD (PORK) LOCK FLUSH 100 UNIT/ML IV SOLN
INTRAVENOUS | Status: DC | PRN
  Administered 2015-03-22: 500 [IU] via INTRAVENOUS

## 2015-03-22 MED ORDER — DEXAMETHASONE SODIUM PHOSPHATE 4 MG/ML IJ SOLN
INTRAMUSCULAR | Status: DC | PRN
  Administered 2015-03-22: 10 mg via INTRAVENOUS

## 2015-03-22 MED ORDER — LACTATED RINGERS IV SOLN
INTRAVENOUS | Status: DC
  Administered 2015-03-22: 07:00:00 via INTRAVENOUS

## 2015-03-22 MED ORDER — MIDAZOLAM HCL 2 MG/2ML IJ SOLN
1.0000 mg | INTRAMUSCULAR | Status: DC | PRN
  Administered 2015-03-22: 2 mg via INTRAVENOUS

## 2015-03-22 MED ORDER — FENTANYL CITRATE (PF) 100 MCG/2ML IJ SOLN
INTRAMUSCULAR | Status: AC
  Filled 2015-03-22: qty 2

## 2015-03-22 MED ORDER — GLYCOPYRROLATE 0.2 MG/ML IJ SOLN: 0.2000 mg | Freq: Once | INTRAMUSCULAR | Status: DC | PRN

## 2015-03-22 MED ORDER — HEPARIN (PORCINE) IN NACL 2-0.9 UNIT/ML-% IJ SOLN
INTRAMUSCULAR | Status: DC | PRN
  Administered 2015-03-22: 100 mL via INTRAVENOUS

## 2015-03-22 MED ORDER — FENTANYL CITRATE (PF) 100 MCG/2ML IJ SOLN
INTRAMUSCULAR | Status: AC
  Filled 2015-03-22: qty 4

## 2015-03-22 MED ORDER — MEPERIDINE HCL 25 MG/ML IJ SOLN: 6.2500 mg | INTRAMUSCULAR | Status: DC | PRN

## 2015-03-22 MED ORDER — HYDROCODONE-ACETAMINOPHEN 5-325 MG PO TABS: 1.0000 | ORAL_TABLET | ORAL | Status: DC | PRN

## 2015-03-22 MED ORDER — FENTANYL CITRATE (PF) 100 MCG/2ML IJ SOLN
50.0000 ug | INTRAMUSCULAR | Status: DC | PRN
  Administered 2015-03-22: 100 ug via INTRAVENOUS

## 2015-03-22 MED ORDER — CEFAZOLIN SODIUM-DEXTROSE 2-3 GM-% IV SOLR
INTRAVENOUS | Status: AC
  Filled 2015-03-22: qty 50

## 2015-03-22 MED ORDER — HYDROCODONE-ACETAMINOPHEN 5-325 MG PO TABS
ORAL_TABLET | ORAL | Status: AC
  Filled 2015-03-22: qty 1

## 2015-03-22 MED ORDER — CEFAZOLIN SODIUM-DEXTROSE 2-3 GM-% IV SOLR: 2.0000 g | INTRAVENOUS | Status: DC

## 2015-03-22 MED ORDER — LIDOCAINE HCL (CARDIAC) 20 MG/ML IV SOLN
INTRAVENOUS | Status: DC | PRN
  Administered 2015-03-22: 50 mg via INTRAVENOUS

## 2015-03-22 MED ORDER — BUPIVACAINE-EPINEPHRINE 0.25% -1:200000 IJ SOLN
INTRAMUSCULAR | Status: DC | PRN
  Administered 2015-03-22: 10 mL

## 2015-03-22 MED ORDER — SODIUM BICARBONATE 4 % IV SOLN
INTRAVENOUS | Status: AC
  Filled 2015-03-22: qty 5

## 2015-03-22 MED ORDER — HEPARIN SOD (PORK) LOCK FLUSH 100 UNIT/ML IV SOLN
INTRAVENOUS | Status: AC
  Filled 2015-03-22: qty 5

## 2015-03-22 MED ORDER — MORPHINE SULFATE 2 MG/ML IJ SOLN: 2.0000 mg | INTRAMUSCULAR | Status: DC | PRN

## 2015-03-22 MED ORDER — CHLORHEXIDINE GLUCONATE 4 % EX LIQD: 1.0000 "application " | Freq: Once | CUTANEOUS | Status: DC

## 2015-03-22 MED ORDER — ONDANSETRON HCL 4 MG/2ML IJ SOLN: 4.0000 mg | INTRAMUSCULAR | Status: DC | PRN

## 2015-03-22 MED ORDER — MIDAZOLAM HCL 2 MG/2ML IJ SOLN
INTRAMUSCULAR | Status: AC
  Filled 2015-03-22: qty 2

## 2015-03-22 MED ORDER — PROPOFOL 10 MG/ML IV BOLUS
INTRAVENOUS | Status: DC | PRN
  Administered 2015-03-22: 200 mg via INTRAVENOUS

## 2015-03-22 MED ORDER — ONDANSETRON HCL 4 MG/2ML IJ SOLN
INTRAMUSCULAR | Status: DC | PRN
  Administered 2015-03-22: 4 mg via INTRAVENOUS

## 2015-03-22 MED ORDER — SCOPOLAMINE 1 MG/3DAYS TD PT72: 1.0000 | MEDICATED_PATCH | Freq: Once | TRANSDERMAL | Status: DC | PRN

## 2015-03-22 MED ORDER — HYDROCODONE-ACETAMINOPHEN 5-325 MG PO TABS
1.0000 | ORAL_TABLET | Freq: Once | ORAL | Status: AC
  Administered 2015-03-22: 1 via ORAL

## 2015-03-22 MED ORDER — HEPARIN (PORCINE) IN NACL 2-0.9 UNIT/ML-% IJ SOLN
INTRAMUSCULAR | Status: AC
  Filled 2015-03-22: qty 500

## 2015-03-22 SURGICAL SUPPLY — 54 items
APL SKNCLS STERI-STRIP NONHPOA (GAUZE/BANDAGES/DRESSINGS) ×1
BAG DECANTER FOR FLEXI CONT (MISCELLANEOUS) ×3 IMPLANT
BENZOIN TINCTURE PRP APPL 2/3 (GAUZE/BANDAGES/DRESSINGS) ×3 IMPLANT
BLADE SURG 11 STRL SS (BLADE) ×3 IMPLANT
BLADE SURG 15 STRL LF DISP TIS (BLADE) ×1 IMPLANT
BLADE SURG 15 STRL SS (BLADE) ×3
CANISTER SUCT 1200ML W/VALVE (MISCELLANEOUS) IMPLANT
CATH SINGLE LUMEN 9.6F (PORTABLE EQUIPMENT SUPPLIES) IMPLANT
CHLORAPREP W/TINT 26ML (MISCELLANEOUS) ×3 IMPLANT
CLEANER CAUTERY TIP 5X5 PAD (MISCELLANEOUS) ×1 IMPLANT
CLOSURE WOUND 1/2 X4 (GAUZE/BANDAGES/DRESSINGS) ×1
COVER BACK TABLE 60X90IN (DRAPES) ×3 IMPLANT
COVER MAYO STAND STRL (DRAPES) ×3 IMPLANT
DECANTER SPIKE VIAL GLASS SM (MISCELLANEOUS) ×3 IMPLANT
DRAPE C-ARM 42X72 X-RAY (DRAPES) ×3 IMPLANT
DRAPE LAPAROTOMY TRNSV 102X78 (DRAPE) ×3 IMPLANT
DRAPE UTILITY XL STRL (DRAPES) ×3 IMPLANT
DRSG TEGADERM 4X4.75 (GAUZE/BANDAGES/DRESSINGS) ×3 IMPLANT
ELECT REM PT RETURN 9FT ADLT (ELECTROSURGICAL) ×3
ELECTRODE REM PT RTRN 9FT ADLT (ELECTROSURGICAL) ×1 IMPLANT
GLOVE BIO SURGEON STRL SZ7 (GLOVE) ×3 IMPLANT
GLOVE BIOGEL PI IND STRL 7.0 (GLOVE) IMPLANT
GLOVE BIOGEL PI IND STRL 7.5 (GLOVE) ×1 IMPLANT
GLOVE BIOGEL PI INDICATOR 7.0 (GLOVE) ×2
GLOVE BIOGEL PI INDICATOR 7.5 (GLOVE) ×2
GLOVE ECLIPSE 6.5 STRL STRAW (GLOVE) ×2 IMPLANT
GOWN STRL REUS W/ TWL LRG LVL3 (GOWN DISPOSABLE) ×1 IMPLANT
GOWN STRL REUS W/TWL LRG LVL3 (GOWN DISPOSABLE) ×3
IV KIT MINILOC 20X1 SAFETY (NEEDLE) IMPLANT
KIT PORT POWER 8FR ISP CVUE (Catheter) ×2 IMPLANT
NDL HYPO 25X1 1.5 SAFETY (NEEDLE) ×1 IMPLANT
NDL SAFETY ECLIPSE 18X1.5 (NEEDLE) IMPLANT
NEEDLE HYPO 18GX1.5 SHARP (NEEDLE)
NEEDLE HYPO 25X1 1.5 SAFETY (NEEDLE) ×3 IMPLANT
NEEDLE SPNL 22GX3.5 QUINCKE BK (NEEDLE) IMPLANT
PACK BASIN DAY SURGERY FS (CUSTOM PROCEDURE TRAY) ×3 IMPLANT
PAD CLEANER CAUTERY TIP 5X5 (MISCELLANEOUS) ×2
PENCIL BUTTON HOLSTER BLD 10FT (ELECTRODE) ×3 IMPLANT
SLEEVE SCD COMPRESS KNEE MED (MISCELLANEOUS) ×3 IMPLANT
SPONGE GAUZE 2X2 8PLY STER LF (GAUZE/BANDAGES/DRESSINGS) ×1
SPONGE GAUZE 2X2 8PLY STRL LF (GAUZE/BANDAGES/DRESSINGS) ×2 IMPLANT
SPONGE GAUZE 4X4 12PLY STER LF (GAUZE/BANDAGES/DRESSINGS) ×2 IMPLANT
STRIP CLOSURE SKIN 1/2X4 (GAUZE/BANDAGES/DRESSINGS) ×2 IMPLANT
SUT MON AB 4-0 PC3 18 (SUTURE) ×3 IMPLANT
SUT PROLENE 2 0 CT2 30 (SUTURE) ×3 IMPLANT
SUT VIC AB 3-0 SH 27 (SUTURE) ×3
SUT VIC AB 3-0 SH 27X BRD (SUTURE) ×1 IMPLANT
SYR 5ML LUER SLIP (SYRINGE) ×3 IMPLANT
SYR CONTROL 10ML LL (SYRINGE) ×3 IMPLANT
TOWEL OR 17X24 6PK STRL BLUE (TOWEL DISPOSABLE) ×3 IMPLANT
TOWEL OR NON WOVEN STRL DISP B (DISPOSABLE) ×3 IMPLANT
TUBE CONNECTING 20'X1/4 (TUBING)
TUBE CONNECTING 20X1/4 (TUBING) IMPLANT
YANKAUER SUCT BULB TIP NO VENT (SUCTIONS) IMPLANT

## 2015-03-22 NOTE — Op Note (Signed)
Preop diagnosis: Multiple sclerosis  Postop diagnosis: Same Procedure performed:  Left subclavian vein port placement Surgeon: Poppi Scantling K. Anesthesia: Gen. via LMA Indications: This is a 24 year old female who was diagnosed last year with multiple sclerosis. She requires treatment with IV infusions. Intravenous access has become very difficult and she has missed the last treatment. We were asked place a port for intravenous access to allow her to have her ongoing treatments.  Description of procedure: The patient is brought to the operating room and placed in a supine position on the operating room table with a small roll behind her shoulders. After an adequate level of general anesthesia was obtained her left neck and chest were prepped with ChloraPrep and draped in sterile fashion. A timeout was taken to ensure the proper patient and proper procedure. The patient was placed in Trendelenburg position. We anesthetized the area below the left clavicle onto the left chest with 0.25% Marcaine with epinephrine. An 18-gauge needle was used to cannulate the subclavian vein with good blood return. The guidewire was passed through the needle and fluoroscopy confirmed that this was heading down the superior vena cava on the right side of the mediastinum. The needle was removed. We made a transverse incision and created a subcutaneous pocket just below the initial insertion site. Small amount of subcutaneous fat was excised. We tunneled from the subcutaneous pocket to the insertion site. We used an 8 [place] ClearView port. The port was assembled and the catheter was tunneled from the 16th pocket to the insertion site. The port was secured with 2 separate 2-0 Prolene sutures. Fluoroscopy was used to estimate the length of the catheter and we cut the catheter at 22 cm. The dilator and breakaway sheath were passed over the wire using fluoroscopic guidance. The dilator and wire were removed. The catheter was then  advanced through the breakaway sheath which was removed. On fluoroscopy it appeared that the tip of the catheter had headed up the right internal jugular vein. We were able to gently pull back on the catheter and then redirected down the superior vena cava. We wrapped aspirate blood and flushed easily with heparinized saline. The tip of the catheter is in the superior vena cava just above the cavoatrial junction. There are no kinks along the course of the catheter. The wound was closed with 3-0 Vicryl. 4-0 Monocryl was used to close the skin at the incision as well as the insertion site. Concentrated heparin solution was then instilled into the catheter using a Huber needle. Benzoin Steri-Strips were applied. A clean dressing was applied. The patient was then extubated and brought to the recovery room in stable condition. All sponge, instrument, and needle counts are correct.  Wilmon Arms. Corliss Skains, MD, [facility]  General/ Trauma Surgery  03/22/2015 8:27 AM

## 2015-03-22 NOTE — Anesthesia Postprocedure Evaluation (Signed)
  Anesthesia Post-op Note  Patient: Beth Santos  Procedure(s) Performed: Procedure(s): LEFT SUBCLAVIAN VEIN PORT PLACEMENT (N/A)  Patient Location: PACU  Anesthesia Type: General   Level of Consciousness: awake, alert  and oriented  Airway and Oxygen Therapy: Patient Spontanous Breathing  Post-op Pain: mild  Post-op Assessment: Post-op Vital signs reviewed  Post-op Vital Signs: Reviewed  Last Vitals:  Filed Vitals:   03/22/15 0915  BP: 120/82  Pulse: 66  Temp: 36.4 C  Resp: 16    Complications: No apparent anesthesia complications

## 2015-03-22 NOTE — Interval H&P Note (Signed)
History and Physical Interval Note:  03/22/2015 7:04 AM  Beth Santos  has presented today for surgery, with the diagnosis of MULTIPLE SCLEROSIS  The various methods of treatment have been discussed with the patient and family. After consideration of risks, benefits and other options for treatment, the patient has consented to  Procedure(s): LEFT SUBCLAVIAN VEIN PORT PLACEMENT (N/A) as a surgical intervention .  The patient's history has been reviewed, patient examined, no change in status, stable for surgery.  I have reviewed the patient's chart and labs.  Questions were answered to the patient's satisfaction.     Shaheed Schmuck K.

## 2015-03-22 NOTE — Anesthesia Preprocedure Evaluation (Addendum)
Anesthesia Evaluation  Patient identified by MRN, date of birth, ID band Patient awake    Reviewed: Allergy & Precautions, NPO status , Patient's Chart, lab work & pertinent test results  Airway Mallampati: I  TM Distance: >3 FB Neck ROM: Full    Dental  (+) Teeth Intact, Dental Advisory Given   Pulmonary former smoker,  breath sounds clear to auscultation        Cardiovascular Rhythm:Regular Rate:Normal     Neuro/Psych  Neuromuscular disease (Hx of MS)    GI/Hepatic   Endo/Other    Renal/GU      Musculoskeletal   Abdominal   Peds  Hematology   Anesthesia Other Findings   Reproductive/Obstetrics                            Anesthesia Physical Anesthesia Plan  ASA: II  Anesthesia Plan: General   Post-op Pain Management:    Induction: Intravenous  Airway Management Planned: LMA  Additional Equipment:   Intra-op Plan:   Post-operative Plan: Extubation in OR  Informed Consent: I have reviewed the patients History and Physical, chart, labs and discussed the procedure including the risks, benefits and alternatives for the proposed anesthesia with the patient or authorized representative who has indicated his/her understanding and acceptance.   Dental advisory given  Plan Discussed with: CRNA, Anesthesiologist and Surgeon  Anesthesia Plan Comments:        Anesthesia Quick Evaluation

## 2015-03-22 NOTE — Anesthesia Procedure Notes (Signed)
Procedure Name: LMA Insertion Date/Time: 03/22/2015 7:30 AM Performed by: Zenia Resides D Pre-anesthesia Checklist: Patient identified, Emergency Drugs available, Suction available and Patient being monitored Patient Re-evaluated:Patient Re-evaluated prior to inductionOxygen Delivery Method: Circle System Utilized Preoxygenation: Pre-oxygenation with 100% oxygen Intubation Type: IV induction Ventilation: Mask ventilation without difficulty LMA: LMA inserted LMA Size: 4.0 Number of attempts: 1 Airway Equipment and Method: Bite block Placement Confirmation: positive ETCO2 Tube secured with: Tape Dental Injury: Teeth and Oropharynx as per pre-operative assessment

## 2015-03-22 NOTE — H&P (View-Only) (Signed)
History of Present Illness Beth Santos. Beth Heslin MD; 03/21/2015 2:07 PM) Patient words: PAC removal.  The patient is a 24 year old female who presents with a complaint of Muscle weakness. Referred by Dr. Everlena Cooper - Neurology for evaluation for port This is a 24 year old female who was diagnosed last year with multiple sclerosis. She is being treated with IV infusions of Tysabri. She missed her last treatment because they were unable to establish peripheral IV access. We were called this morning to see the patient for possible port placement so that she may receive her infusions.   Other Problems Beth Santos, CMA; 03/21/2015 11:58 AM) Anxiety Disorder Depression Other disease, cancer, significant illness  Past Surgical History Beth Santos, CMA; 03/21/2015 11:58 AM) Appendectomy  Diagnostic Studies History Beth Santos, CMA; 03/21/2015 11:58 AM) Colonoscopy never Mammogram >3 years ago Pap Smear 1-5 years ago  Allergies Beth Santos, CMA; 03/21/2015 11:47 AM) Loracarbef *ANTI-INFECTIVE AGENTS - MISC.*  Medication History Beth Santos, CMA; 03/21/2015 11:48 AM) Topamax (100MG  Tablet, Oral) Active. Phentermine HCl (37.5MG  Capsule, Oral) Active. Sprintec 28 (0.25-35MG -MCG Tablet, Oral) Active.  Social History Beth Santos, CMA; 03/21/2015 11:58 AM) Alcohol use Occasional alcohol use. Caffeine use Coffee. No drug use Tobacco use Former smoker.  Family History Beth Santos, CMA; 03/21/2015 11:58 AM) Family history unknown First Degree Relatives  Pregnancy / Birth History Beth Santos, CMA; 03/21/2015 11:58 AM) Age at menarche 7 years. Contraceptive History Oral contraceptives. Gravida 0 Para 0 Regular periods  Review of Systems (Sonya Santos CMA; 03/21/2015 11:58 AM) General Present- Fatigue. Not Present- Appetite Loss, Chills, Fever, Night Sweats, Weight Gain and Weight Loss. Skin Not Present- Change in Wart/Mole, Dryness, Hives, Jaundice, New Lesions, Non-Healing  Wounds, Rash and Ulcer. HEENT Present- Ringing in the Ears and Wears glasses/contact lenses. Not Present- Earache, Hearing Loss, Hoarseness, Nose Bleed, Oral Ulcers, Seasonal Allergies, Sinus Pain, Sore Throat, Visual Disturbances and Yellow Eyes. Respiratory Not Present- Bloody sputum, Chronic Cough, Difficulty Breathing, Snoring and Wheezing. Breast Not Present- Breast Mass, Breast Pain, Nipple Discharge and Skin Changes. Cardiovascular Not Present- Chest Pain, Difficulty Breathing Lying Down, Leg Cramps, Palpitations, Rapid Heart Rate, Shortness of Breath and Swelling of Extremities. Gastrointestinal Present- Constipation. Not Present- Abdominal Pain, Bloating, Bloody Stool, Change in Bowel Habits, Chronic diarrhea, Difficulty Swallowing, Excessive gas, Gets full quickly at meals, Hemorrhoids, Indigestion, Nausea, Rectal Pain and Vomiting. Female Genitourinary Not Present- Frequency, Nocturia, Painful Urination, Pelvic Pain and Urgency. Musculoskeletal Present- Joint Stiffness and Muscle Weakness. Not Present- Back Pain, Joint Pain, Muscle Pain and Swelling of Extremities. Neurological Present- Numbness, Tingling and Weakness. Not Present- Decreased Memory, Fainting, Headaches, Seizures, Tremor and Trouble walking. Psychiatric Present- Anxiety, Depression and Frequent crying. Not Present- Bipolar, Change in Sleep Pattern and Fearful. Endocrine Present- Cold Intolerance and Heat Intolerance. Not Present- Excessive Hunger, Hair Changes, Hot flashes and New Diabetes. Hematology Not Present- Easy Bruising, Excessive bleeding, Gland problems, HIV and Persistent Infections.   Vitals (Sonya Santos CMA; 03/21/2015 11:47 AM) 03/21/2015 11:46 AM Weight: 190 lb Height: 69in Body Surface Area: 2.05 m Body Mass Index: 28.06 kg/m Temp.: 97.8F(Temporal)  Pulse: 72 (Regular)  BP: 128/80 (Sitting, Left Arm, Standard)    Physical Exam Molli Hazard K. Valerian Jewel MD; 03/21/2015 2:07 PM) The physical exam  findings are as follows: Note:WDWN in NAD Lungs - CTA B- Left chest - no scars CV - RRR Abd - soft, non-tender    Assessment & Plan Molli Hazard K. Yi Haugan MD; 03/21/2015 2:08 PM) MULTIPLE SCLEROSIS (340  G35) Current Plans  Schedule  for Surgery - Port placement. The surgical procedure has been discussed with the patient. Potential risks, benefits, alternative treatments, and expected outcomes have been explained. All of the patient's questions at this time have been answered. The likelihood of reaching the patient's treatment goal is good. The patient understand the proposed surgical procedure and wishes to proceed. Note:It is unclear when she will receive her infusion, so we will not leave the port accessed.     Ulysees Robarts K. Doralyn Kirkes, MD, FACS [facility]  General/ Trauma Surgery  03/21/2015 2:09 PM  

## 2015-03-22 NOTE — Discharge Instructions (Signed)
°Post Anesthesia Home Care Instructions ° °Activity: °Get plenty of rest for the remainder of the day. A responsible adult should stay with you for 24 hours following the procedure.  °For the next 24 hours, DO NOT: °-Drive a car °-Operate machinery °-Drink alcoholic beverages °-Take any medication unless instructed by your physician °-Make any legal decisions or sign important papers. ° °Meals: °Start with liquid foods such as gelatin or soup. Progress to regular foods as tolerated. Avoid greasy, spicy, heavy foods. If nausea and/or vomiting occur, drink only clear liquids until the nausea and/or vomiting subsides. Call your physician if vomiting continues. ° °Special Instructions/Symptoms: °Your throat may feel dry or sore from the anesthesia or the breathing tube placed in your throat during surgery. If this causes discomfort, gargle with warm salt water. The discomfort should disappear within 24 hours. ° °If you had a scopolamine patch placed behind your ear for the management of post- operative nausea and/or vomiting: ° °1. The medication in the patch is effective for 72 hours, after which it should be removed.  Wrap patch in a tissue and discard in the trash. Wash hands thoroughly with soap and water. °2. You may remove the patch earlier than 72 hours if you experience unpleasant side effects which may include dry mouth, dizziness or visual disturbances. °3. Avoid touching the patch. Wash your hands with soap and water after contact with the patch. °  ° ° ° °PORT-A-CATH: POST OP INSTRUCTIONS ° °Always review your discharge instruction sheet given to you by the facility where your surgery was performed.  ° °1. A prescription for pain medication may be given to you upon discharge. Take your pain medication as prescribed, if needed. If narcotic pain medicine is not needed, then you make take acetaminophen (Tylenol) or ibuprofen (Advil) as needed.  °2. Take your usually prescribed medications unless otherwise  directed. °3. If you need a refill on your pain medication, please contact our office. All narcotic pain medicine now requires a paper prescription.  Phoned in and fax refills are no longer allowed by law.  Prescriptions will not be filled after 5 pm or on weekends.  °4. You should follow a light diet for the remainder of the day after your procedure. °5. Most patients will experience some mild swelling and/or bruising in the area of the incision. It may take several days to resolve. °6. It is common to experience some constipation if taking pain medication after surgery. Increasing fluid intake and taking a stool softener (such as Colace) will usually help or prevent this problem from occurring. A mild laxative (Milk of Magnesia or Miralax) should be taken according to package directions if there are no bowel movements after 48 hours.  °7. Unless discharge instructions indicate otherwise, you may remove your bandages 48 hours after surgery, and you may shower at that time. You may have steri-strips (small white skin tapes) in place directly over the incision.  These strips should be left on the skin for 7-10 days.  If your surgeon used Dermabond (skin glue) on the incision, you may shower in 24 hours.  The glue will flake off over the next 2-3 weeks.  °8. If your port is left accessed at the end of surgery (needle left in port), the dressing cannot get wet and should only by changed by a healthcare professional. When the port is no longer accessed (when the needle has been removed), follow step 7.   °9. ACTIVITIES:  Limit activity involving your arms   for the next 72 hours. Do no strenuous exercise or activity for 1 week. You may drive when you are no longer taking prescription pain medication, you can comfortably wear a seatbelt, and you can maneuver your car. °10.You may need to see your doctor in the office for a follow-up appointment.  Please °      check with your doctor.  °11.When you receive a new  Port-a-Cath, you will get a product guide and  °      ID card.  Please keep them in case you need them. ° °WHEN TO CALL YOUR DOCTOR ([phone]): °1. Fever over 101.0 °2. Chills °3. Continued bleeding from incision °4. Increased redness and tenderness at the site °5. Shortness of breath, difficulty breathing ° ° °The clinic staff is available to answer your questions during regular business hours. Please don’t hesitate to call and ask to speak to one of the nurses or medical assistants for clinical concerns. If you have a medical emergency, go to the nearest emergency room or call 911.  A surgeon from [facility] is always on call at the hospital.  ° ° ° °For further information, please visit [email or web address] ° ° ° ° °

## 2015-03-22 NOTE — Transfer of Care (Signed)
Immediate Anesthesia Transfer of Care Note  Patient: Beth Santos  Procedure(s) Performed: Procedure(s): LEFT SUBCLAVIAN VEIN PORT PLACEMENT (N/A)  Patient Location: PACU  Anesthesia Type:General  Level of Consciousness: awake  Airway & Oxygen Therapy: Patient Spontanous Breathing and Patient connected to face mask oxygen  Post-op Assessment: Report given to RN and Post -op Vital signs reviewed and stable  Post vital signs: Reviewed and stable  Last Vitals:  Filed Vitals:   03/22/15 0624  BP: 112/73  Pulse: 68  Temp: 36.7 C  Resp: 20    Complications: No apparent anesthesia complications

## 2015-03-23 ENCOUNTER — Emergency Department (HOSPITAL_COMMUNITY): Payer: BLUE CROSS/BLUE SHIELD

## 2015-03-23 ENCOUNTER — Encounter (HOSPITAL_COMMUNITY): Payer: Self-pay | Admitting: *Deleted

## 2015-03-23 ENCOUNTER — Inpatient Hospital Stay (HOSPITAL_COMMUNITY)
Admission: EM | Admit: 2015-03-23 | Discharge: 2015-03-27 | DRG: 201 | Disposition: A | Discharge status: Home / Self Care | Payer: BLUE CROSS/BLUE SHIELD | Attending: Surgery | Admitting: Surgery

## 2015-03-23 DIAGNOSIS — J939 Pneumothorax, unspecified: Secondary | ICD-10-CM

## 2015-03-23 DIAGNOSIS — Z91048 Other nonmedicinal substance allergy status: Secondary | ICD-10-CM

## 2015-03-23 DIAGNOSIS — Z87891 Personal history of nicotine dependence: Secondary | ICD-10-CM

## 2015-03-23 DIAGNOSIS — J95811 Postprocedural pneumothorax: Secondary | ICD-10-CM | POA: Diagnosis not present

## 2015-03-23 DIAGNOSIS — Z9689 Presence of other specified functional implants: Secondary | ICD-10-CM

## 2015-03-23 DIAGNOSIS — G35 Multiple sclerosis: Secondary | ICD-10-CM | POA: Diagnosis present

## 2015-03-23 DIAGNOSIS — Z885 Allergy status to narcotic agent status: Secondary | ICD-10-CM

## 2015-03-23 DIAGNOSIS — F419 Anxiety disorder, unspecified: Secondary | ICD-10-CM | POA: Diagnosis present

## 2015-03-23 DIAGNOSIS — Z915 Personal history of self-harm: Secondary | ICD-10-CM

## 2015-03-23 HISTORY — DX: Depression, unspecified: F32.A

## 2015-03-23 HISTORY — PX: CHEST TUBE INSERTION: SHX231

## 2015-03-23 HISTORY — DX: Major depressive disorder, single episode, unspecified: F32.9

## 2015-03-23 HISTORY — DX: Anxiety disorder, unspecified: F41.9

## 2015-03-23 LAB — COMPREHENSIVE METABOLIC PANEL
ALT: 14 U/L (ref 14–54)
AST: 17 U/L (ref 15–41)
Albumin: 3.5 g/dL (ref 3.5–5.0)
Alkaline Phosphatase: 63 U/L (ref 38–126)
Anion gap: 9 (ref 5–15)
BILIRUBIN TOTAL: 0.1 mg/dL — AB (ref 0.3–1.2)
BUN: 11 mg/dL (ref 6–20)
CO2: 21 mmol/L — AB (ref 22–32)
CREATININE: 0.91 mg/dL (ref 0.44–1.00)
Calcium: 8.7 mg/dL — ABNORMAL LOW (ref 8.9–10.3)
Chloride: 109 mmol/L (ref 101–111)
GFR calc Af Amer: >60 mL/min (ref >60)
GFR calc non Af Amer: >60 mL/min (ref >60)
Glucose, Bld: 100 mg/dL — ABNORMAL HIGH (ref 65–99)
POTASSIUM: 3.1 mmol/L — AB (ref 3.5–5.1)
SODIUM: 139 mmol/L (ref 135–145)
Total Protein: 6.7 g/dL (ref 6.5–8.1)

## 2015-03-23 LAB — CBC WITH DIFFERENTIAL/PLATELET
Basophils Absolute: 0 10*3/uL (ref 0.0–0.1)
Basophils Relative: 0 % (ref 0–1)
EOS PCT: 0 % (ref 0–5)
Eosinophils Absolute: 0 10*3/uL (ref 0.0–0.7)
HCT: 35.9 % — ABNORMAL LOW (ref 36.0–46.0)
Hemoglobin: 12 g/dL (ref 12.0–15.0)
LYMPHS PCT: 39 % (ref 12–46)
Lymphs Abs: 5.5 10*3/uL — ABNORMAL HIGH (ref 0.7–4.0)
MCH: 28.7 pg (ref 26.0–34.0)
MCHC: 33.4 g/dL (ref 30.0–36.0)
MCV: 85.9 fL (ref 78.0–100.0)
Monocytes Absolute: 1 10*3/uL (ref 0.1–1.0)
Monocytes Relative: 7 % (ref 3–12)
NEUTROS ABS: 7.6 10*3/uL (ref 1.7–7.7)
Neutrophils Relative %: 54 % (ref 43–77)
PLATELETS: 229 10*3/uL (ref 150–400)
RBC: 4.18 MIL/uL (ref 3.87–5.11)
RDW: 13.8 % (ref 11.5–15.5)
WBC: 14.1 10*3/uL — AB (ref 4.0–10.5)

## 2015-03-23 LAB — I-STAT TROPONIN, ED: TROPONIN I, POC: 0 ng/mL (ref 0.00–0.08)

## 2015-03-23 MED ORDER — SODIUM CHLORIDE 0.9 % IJ SOLN: 10.0000 mL | INTRAMUSCULAR | Status: DC | PRN

## 2015-03-23 MED ORDER — POTASSIUM CHLORIDE IN NACL 20-0.9 MEQ/L-% IV SOLN
INTRAVENOUS | Status: DC
  Administered 2015-03-23 – 2015-03-27 (×5): via INTRAVENOUS
  Filled 2015-03-23 (×6): qty 1000

## 2015-03-23 MED ORDER — LIDOCAINE HCL (PF) 1 % IJ SOLN
0.0000 mL | Freq: Once | INTRAMUSCULAR | Status: AC | PRN
  Administered 2015-03-23: 30 mL via INTRADERMAL
  Filled 2015-03-23 (×2): qty 30

## 2015-03-23 MED ORDER — DOCUSATE SODIUM 100 MG PO CAPS: 100.0000 mg | ORAL_CAPSULE | Freq: Two times a day (BID) | ORAL | Status: DC

## 2015-03-23 MED ORDER — IPRATROPIUM-ALBUTEROL 0.5-2.5 (3) MG/3ML IN SOLN
3.0000 mL | Freq: Four times a day (QID) | RESPIRATORY_TRACT | Status: DC
  Administered 2015-03-23 (×3): 3 mL via RESPIRATORY_TRACT
  Filled 2015-03-23 (×2): qty 3

## 2015-03-23 MED ORDER — LORAZEPAM 0.5 MG PO TABS: 0.5000 mg | ORAL_TABLET | Freq: Two times a day (BID) | ORAL | Status: DC | PRN

## 2015-03-23 MED ORDER — IPRATROPIUM-ALBUTEROL 0.5-2.5 (3) MG/3ML IN SOLN
RESPIRATORY_TRACT | Status: AC
  Filled 2015-03-23: qty 3

## 2015-03-23 MED ORDER — MIDAZOLAM HCL 2 MG/2ML IJ SOLN
INTRAMUSCULAR | Status: AC
  Filled 2015-03-23: qty 2

## 2015-03-23 MED ORDER — DIPHENHYDRAMINE HCL 50 MG/ML IJ SOLN: 12.5000 mg | Freq: Four times a day (QID) | INTRAMUSCULAR | Status: DC | PRN

## 2015-03-23 MED ORDER — ENOXAPARIN SODIUM 40 MG/0.4ML ~~LOC~~ SOLN
40.0000 mg | Freq: Every day | SUBCUTANEOUS | Status: DC
  Administered 2015-03-23 – 2015-03-26 (×4): 40 mg via SUBCUTANEOUS
  Filled 2015-03-23 (×6): qty 0.4

## 2015-03-23 MED ORDER — HYDROMORPHONE HCL 1 MG/ML IJ SOLN
1.0000 mg | Freq: Once | INTRAMUSCULAR | Status: AC
Start: 2015-03-23 — End: 2015-03-23
  Administered 2015-03-23: 1 mg via INTRAVENOUS
  Filled 2015-03-23: qty 1

## 2015-03-23 MED ORDER — MORPHINE SULFATE 2 MG/ML IJ SOLN
INTRAMUSCULAR | Status: AC
  Administered 2015-03-23: 2 mg via INTRAVENOUS
  Filled 2015-03-23: qty 1

## 2015-03-23 MED ORDER — DOCUSATE SODIUM 100 MG PO CAPS
100.0000 mg | ORAL_CAPSULE | Freq: Two times a day (BID) | ORAL | Status: DC
  Administered 2015-03-23 – 2015-03-25 (×6): 100 mg via ORAL
  Filled 2015-03-23 (×8): qty 1

## 2015-03-23 MED ORDER — HYDROMORPHONE HCL 1 MG/ML IJ SOLN
0.5000 mg | INTRAMUSCULAR | Status: DC | PRN
  Filled 2015-03-23 (×2): qty 1

## 2015-03-23 MED ORDER — MORPHINE SULFATE 4 MG/ML IJ SOLN
4.0000 mg | Freq: Once | INTRAMUSCULAR | Status: AC
  Administered 2015-03-23: 4 mg via INTRAVENOUS
  Filled 2015-03-23: qty 1

## 2015-03-23 MED ORDER — HYDROCODONE-ACETAMINOPHEN 5-325 MG PO TABS
1.0000 | ORAL_TABLET | ORAL | Status: DC | PRN
  Administered 2015-03-23 – 2015-03-25 (×5): 2 via ORAL
  Filled 2015-03-23 (×6): qty 2

## 2015-03-23 MED ORDER — ONDANSETRON HCL 4 MG/2ML IJ SOLN
4.0000 mg | Freq: Once | INTRAMUSCULAR | Status: AC
  Administered 2015-03-23: 4 mg via INTRAVENOUS
  Filled 2015-03-23: qty 2

## 2015-03-23 MED ORDER — POLYETHYLENE GLYCOL 3350 17 G PO PACK
17.0000 g | PACK | Freq: Every day | ORAL | Status: DC
  Administered 2015-03-23 – 2015-03-25 (×3): 17 g via ORAL
  Filled 2015-03-23 (×4): qty 1

## 2015-03-23 MED ORDER — METHOCARBAMOL 500 MG PO TABS
1000.0000 mg | ORAL_TABLET | Freq: Three times a day (TID) | ORAL | Status: DC | PRN
  Administered 2015-03-23 – 2015-03-25 (×3): 1000 mg via ORAL
  Filled 2015-03-23 (×4): qty 2

## 2015-03-23 MED ORDER — MORPHINE SULFATE 2 MG/ML IJ SOLN
2.0000 mg | Freq: Once | INTRAMUSCULAR | Status: AC
  Administered 2015-03-23: 2 mg via INTRAVENOUS
  Filled 2015-03-23: qty 1

## 2015-03-23 MED ORDER — DIPHENHYDRAMINE HCL 12.5 MG/5ML PO ELIX: 12.5000 mg | ORAL_SOLUTION | Freq: Four times a day (QID) | ORAL | Status: DC | PRN

## 2015-03-23 MED ORDER — IPRATROPIUM-ALBUTEROL 0.5-2.5 (3) MG/3ML IN SOLN
3.0000 mL | Freq: Four times a day (QID) | RESPIRATORY_TRACT | Status: DC
  Administered 2015-03-24: 3 mL via RESPIRATORY_TRACT
  Filled 2015-03-23: qty 3

## 2015-03-23 NOTE — Procedures (Signed)
Chest Tube Insertion Procedure Note  Indications:  Clinically significant Pneumothorax/50%  Pre-operative Diagnosis: Pneumothorax  Post-operative Diagnosis: Pneumothorax  Procedure Details  Informed consent was obtained for the procedure, including sedation.  Risks of lung perforation, hemorrhage, arrhythmia, and adverse drug reaction were discussed. Time out performed.  After sterile skin prep, using standard technique, a 20 French tube was placed in the left anterolateral 6th rib space.  Findings: Minimal fluid, air under pressure.  CXR:  CT up the lateral wall to near the apex.  Complete re-expansion of the lung.  Estimated Blood Loss:  Minimal         Specimens:  None              Complications:  None; patient tolerated the procedure well.         Disposition: Patient done in the ED, will be admitted soon to the floor.         Condition: stable  Attending Attestation: I performed the procedure.  Marta Lamas. Gae Bon, MD, FACS [phone] [facility]

## 2015-03-23 NOTE — ED Notes (Signed)
[facility] PA at bedside

## 2015-03-23 NOTE — ED Provider Notes (Signed)
CSN: [id number]     Arrival date & time 03/23/15  0754 History   First MD Initiated Contact with Patient 03/23/15 0756     Chief Complaint  Patient presents with  . Shortness of Breath     (Consider location/radiation/quality/duration/timing/severity/associated sxs/prior Treatment) Patient is a 24 y.o. female presenting with shortness of breath.  Shortness of Breath Severity:  Moderate Onset quality:  Gradual Duration:  1 day Timing:  Constant Progression:  Worsening Chronicity:  New Context comment:  Had port placed yesterday with shortness of breath developing after this Relieved by:  Nothing Exacerbated by: laying down. Ineffective treatments:  None tried Associated symptoms: chest pain, cough and hemoptysis   Associated symptoms: no abdominal pain, no fever, no headaches, no neck pain, no rash, no sore throat, no syncope, no vomiting and no wheezing   Risk factors: recent surgery (recent port placement)     Past Medical History  Diagnosis Date  . Multiple sclerosis   . History of seizure     age 23 - due to intentional overdose of multiple medications  . History of suicide attempt as a teenager  . History of dislocation     of jaw  . Dental crown present     right upper  . Poor venous access    Past Surgical History  Procedure Laterality Date  . Appendectomy     Family History  Problem Relation Age of Onset  . Diabetes Father   . Rheum arthritis Mother   . Cancer Maternal Grandmother     renal cell   . Diabetes Maternal Grandfather   . Hypertension Maternal Grandfather   . Alzheimer's disease Maternal Grandfather   . Cancer Paternal Grandmother     lung   History  Substance Use Topics  . Smoking status: Former Smoker -- 0.00 packs/day    Quit date: 07/10/2013  . Smokeless tobacco: Never Used  . Alcohol Use: 0.0 oz/week    0 Standard drinks or equivalent per week     Comment: occasionally   OB History    No data available     Review of Systems   Constitutional: Negative for fever.  HENT: Negative for sore throat.   Eyes: Negative for visual disturbance.  Respiratory: Positive for cough, hemoptysis and shortness of breath. Negative for wheezing.   Cardiovascular: Positive for chest pain. Negative for syncope.  Gastrointestinal: Negative for vomiting and abdominal pain.  Genitourinary: Negative for difficulty urinating.  Musculoskeletal: Negative for back pain and neck pain.  Skin: Negative for rash.  Neurological: Negative for syncope and headaches.      Allergies  Oxycodone-acetaminophen and Other  Home Medications   Prior to Admission medications   Medication Sig Start Date End Date Taking? Authorizing Provider  HYDROcodone-acetaminophen (NORCO/VICODIN) 5-325 MG per tablet Take 1 tablet by mouth every 4 (four) hours as needed. 03/22/15   Manus Rudd, MD  Natalizumab (TYSABRI IV) Inject 300 mg into the vein every 28 (twenty-eight) days.    Historical Provider, MD  norgestimate-ethinyl estradiol (SPRINTEC 28) 0.25-35 MG-MCG tablet Take 1 tablet by mouth daily.    Historical Provider, MD  phentermine 37.5 MG capsule Take 37.5 mg by mouth every morning.    Historical Provider, MD  topiramate (TOPAMAX) 50 MG tablet Take 50 mg by mouth every morning.    Historical Provider, MD   LMP 03/07/2015 (Approximate) Physical Exam  Constitutional: She is oriented to person, place, and time. She appears well-developed and well-nourished. No distress.  HENT:  Head: Normocephalic and atraumatic.  Eyes: Conjunctivae and EOM are normal.  Neck: Normal range of motion.  Cardiovascular: Normal rate, regular rhythm, normal heart sounds and intact distal pulses.  Exam reveals no gallop and no friction rub.   No murmur heard. Pulmonary/Chest: Effort normal. No respiratory distress. She has decreased breath sounds (left side). She has no wheezes. She has no rales.  Abdominal: Soft. She exhibits no distension. There is no tenderness. There is no  guarding.  Musculoskeletal: She exhibits no edema or tenderness.  Neurological: She is alert and oriented to person, place, and time.  Skin: Skin is warm and dry. No rash noted. She is not diaphoretic. No erythema.  Nursing note and vitals reviewed.   ED Course  Procedures (including critical care time) Labs Review Labs Reviewed  CBC WITH DIFFERENTIAL/PLATELET  COMPREHENSIVE METABOLIC PANEL    Imaging Review Dg Chest Port 1 View  03/22/2015   CLINICAL DATA:  24 year old female with history of multiple sclerosis and seizures with poor venous access post Port-A-Cath placement. Initial encounter.  EXAM: PORTABLE CHEST - 1 VIEW  COMPARISON:  None.  FINDINGS: Left central line is in place. Tip at the level of the mid superior vena cava at the level of the azygos vein.  No gross pneumothorax.  No infiltrate or congestive heart failure.  Heart size within normal limits.  IMPRESSION: Left central line is in place. Tip at the level of the mid superior vena cava at the level of the azygos vein.  No gross pneumothorax.   Electronically Signed   By: Lacy Duverney M.D.   On: 03/22/2015 08:39   Dg Fluoro Guide Cv Line-no Report  03/22/2015   CLINICAL DATA:    FLOURO GUIDE CV LINE  Fluoroscopy was utilized by the requesting physician.  No radiographic  interpretation.      EKG Interpretation None      Emergency Ultrasound: Limited Thoracic Performed and interpreted by Dr Dalene Seltzer Longitudinal view of anterior left and right lung fields in real-time with linear probe. Indication: shortness of breath Findings: Right lung sliding. Left side no lung slide. Interpretation: Evidence of pneumothorax on the left Images electronically archived.      MDM   Final diagnoses:  None   24 year old female with history of MS and port placement done yesterday presents with shortness of breath that has been present since her port placement yesterday. Differential diagnosis includes pneumothorax, pulmonary  embolus, ACS, pneumonia.  EKG was performed and evaluated by me and showed normal sinus rhythm, with new RSR, however no evidence of ST changes/ischemia or pericarditis. Bedside US revealed left sided pneumothorax. Chest x-ray was done and evaluated by me which showed left sided pneumothorax 50%.  Given presence of pneumothorax doubt other causes of chest pain/dyspnea.  Discussed with surgery and Dr. Lindie Spruce evaluated patient and placed chest tube.  Repeat XR showed improvement. Given IV pain medications and admitted to surgery for further care.     Alvira Monday, MD 03/23/15 [phone]

## 2015-03-23 NOTE — ED Notes (Signed)
dR WYATT AT BEDSIDE PLACING LEFT CHEST TUBE. PT TOLERATED PROCEDURE WELL.  NOTED WILL CONTINUE TO MONITOR AND ASSESS. XRAY CALLED FOR REPEAT

## 2015-03-23 NOTE — Progress Notes (Signed)
Patient admitted to [facility]. Alert and oriented. Left chest tube intact. C/O pain with deep breaths and with movement. Medicated prior to transfer from E.D. Will continue to monitor.

## 2015-03-23 NOTE — ED Notes (Signed)
Pt placed on 2L per MD request.

## 2015-03-23 NOTE — ED Notes (Signed)
Pt states that she bagan feeling SOB yesterday after having her port a cath placed. Pt is going through treatment for MS. Pt states that SOB is worse when flat. Denies change with movement or exertion.

## 2015-03-23 NOTE — H&P (Signed)
[facility] Admission Note  Beth Santos 12/13/1990  [id number].    Requesting MD: Dr. Billy Fischer Chief Complaint/Reason for Consult: Pneumothorax  HPI:  24 y/o white female with multiple sclerosis and anxiety who presents to [facility] with left sided chest pain and SOB since yesterday after her port-a-cath placement by Dr. Georgette Dover.  She was referred for port-a-cath by Dr. Tomi Likens of Neurology because she is being treated with IV infusions of Tysabri for MS.  Post-op CXR showed no gross pneumothorax.  She came to the ED because the pain, DOE, and SOB progressively worsened.  A CT scan was obtained and showed a 50% pneumothorax on the left.  No headache, no N/V/D or abdominal pain.  No fever/chills.  No other complaints.  Dr. Hulen Skains placed a chest tube in the ER.  She will be admitted to the floor for observation, pulmonary toilet, and pain control.  Husband accompanies her.   ROS: All systems reviewed and otherwise negative except for as above  Family History  Problem Relation Age of Onset  . Diabetes Father   . Rheum arthritis Mother   . Cancer Maternal Grandmother     renal cell   . Diabetes Maternal Grandfather   . Hypertension Maternal Grandfather   . Alzheimer's disease Maternal Grandfather   . Cancer Paternal Grandmother     lung    Past Medical History  Diagnosis Date  . Multiple sclerosis   . History of seizure     age 46 - due to intentional overdose of multiple medications  . History of suicide attempt as a teenager  . History of dislocation     of jaw  . Dental crown present     right upper  . Poor venous access     Past Surgical History  Procedure Laterality Date  . Appendectomy    . Chest tube insertion Left 03/23/2015    Dr. Hulen Skains  . Port-a-cath placement Left 03/22/2015    Dr. Georgette Dover    Social History:  reports that she quit smoking about 20 months ago. She has never used smokeless tobacco. She reports that she drinks alcohol. She reports that she does  not use illicit drugs.  Allergies:  Allergies  Allergen Reactions  . Oxycodone-Acetaminophen Nausea And Vomiting  . Other Rash    STAINLESS STEEL     (Not in a hospital admission)  Blood pressure 114/71, pulse 96, temperature 97.5 F (36.4 C), temperature source Oral, resp. rate 17, height _0  (1.778 m), weight 86.909 kg (191 lb 9.6 oz), last menstrual period 03/07/2015, SpO2 100 %. Physical Exam: General: pleasant, WD/WN white female who is laying in bed in mild distress due to pain and SOB HEENT: head is normocephalic, atraumatic.  Sclera are noninjected.  PERRL, wears colored contacts.  Ears and nose without any masses or lesions.  Mouth is pink and moist Heart: regular, rate, and rhythm.  No obvious murmurs, gallops, or rubs noted.  Palpable pedal pulses bilaterally Lungs: CTAB, but very poor effort, diminished breath sounds due to poor effort, no wheezes, rhonchi, or rales heard with exam.  Left chest tube in place.   Abd: soft, NT/ND, +BS, no masses, hernias, or organomegaly MS: all 4 extremities are symmetrical with no cyanosis, clubbing, or edema. Skin: warm and dry with no masses, lesions, or rashes Psych: A&Ox3 but crying and very emotional   Results for orders placed or performed during the hospital encounter of 03/23/15 (from the past 48 hour(s))  CBC with Differential  Status: Abnormal   Collection Time: 03/23/15  8:30 AM  Result Value Ref Range   WBC 14.1 (H) 4.0 - 10.5 K/uL   RBC 4.18 3.87 - 5.11 MIL/uL   Hemoglobin 12.0 12.0 - 15.0 g/dL   HCT 35.9 (L) 36.0 - 46.0 %   MCV 85.9 78.0 - 100.0 fL   MCH 28.7 26.0 - 34.0 pg   MCHC 33.4 30.0 - 36.0 g/dL   RDW 13.8 11.5 - 15.5 %   Platelets 229 150 - 400 K/uL   Neutrophils Relative % 54 43 - 77 %   Neutro Abs 7.6 1.7 - 7.7 K/uL   Lymphocytes Relative 39 12 - 46 %   Lymphs Abs 5.5 (H) 0.7 - 4.0 K/uL   Monocytes Relative 7 3 - 12 %   Monocytes Absolute 1.0 0.1 - 1.0 K/uL   Eosinophils Relative 0 0 - 5 %    Eosinophils Absolute 0.0 0.0 - 0.7 K/uL   Basophils Relative 0 0 - 1 %   Basophils Absolute 0.0 0.0 - 0.1 K/uL  Comprehensive metabolic panel     Status: Abnormal   Collection Time: 03/23/15  8:30 AM  Result Value Ref Range   Sodium 139 135 - 145 mmol/L   Potassium 3.1 (L) 3.5 - 5.1 mmol/L   Chloride 109 101 - 111 mmol/L   CO2 21 (L) 22 - 32 mmol/L   Glucose, Bld 100 (H) 65 - 99 mg/dL   BUN 11 6 - 20 mg/dL   Creatinine, Ser 0.91 0.44 - 1.00 mg/dL   Calcium 8.7 (L) 8.9 - 10.3 mg/dL   Total Protein 6.7 6.5 - 8.1 g/dL   Albumin 3.5 3.5 - 5.0 g/dL   AST 17 15 - 41 U/L   ALT 14 14 - 54 U/L   Alkaline Phosphatase 63 38 - 126 U/L   Total Bilirubin 0.1 (L) 0.3 - 1.2 mg/dL   GFR calc non Af Amer >60 >60 mL/min   GFR calc Af Amer >60 >60 mL/min    Comment: (NOTE) The eGFR has been calculated using the CKD EPI equation. This calculation has not been validated in all clinical situations. eGFR's persistently <60 mL/min signify possible Chronic Kidney Disease.    Anion gap 9 5 - 15  I-stat troponin, ED     Status: None   Collection Time: 03/23/15  8:32 AM  Result Value Ref Range   Troponin i, poc 0.00 0.00 - 0.08 ng/mL   Comment 3            Comment: Due to the release kinetics of cTnI, a negative result within the first hours of the onset of symptoms does not rule out myocardial infarction with certainty. If myocardial infarction is still suspected, repeat the test at appropriate intervals.    Dg Chest Port 1 View  03/23/2015   CLINICAL DATA:  Pneumothorax.  Chest tube placement.  EXAM: PORTABLE CHEST - 1 VIEW  COMPARISON:  03/23/2015 at 8:29 a.m.  FINDINGS: 5% residual left apical pneumothorax following left-sided chest tube placement.  Left power injectable Port-A-Cath tip:  SVC.  Bandlike subsegmental atelectasis at the left lung base. Cardiac and mediastinal margins appear normal.  IMPRESSION: 1. Left chest tube placement. The left pneumothorax has been dramatically reduced in size,  currently about 5% of left hemithoracic volume. 2. Subsegmental atelectasis at the left lung base.   Electronically Signed   By: Van Clines M.D.   On: 03/23/2015 10:12   Dg Chest Portable 1  View  03/23/2015   CLINICAL DATA:  Port-A-Cath placement yesterday. Shortness of breath. Coughing blood this morning.  EXAM: PORTABLE CHEST - 1 VIEW  COMPARISON:  03/22/2015  FINDINGS: Patient now has a large left pneumothorax measuring approximately 50%. The trachea remains midline. There is a left subclavian Port-A-Cath with the tip in the upper SVC region. Right lung is clear. Heart size is normal.  IMPRESSION: Large left pneumothorax without mediastinal shift.  Critical Value/emergent results were called by telephone at the time of interpretation on 03/23/2015 at 8:43 am to Dr. Gareth Morgan , who verbally acknowledged these results.   Electronically Signed   By: Markus Daft M.D.   On: 03/23/2015 08:45   Dg Chest Port 1 View  03/22/2015   CLINICAL DATA:  24 year old female with history of multiple sclerosis and seizures with poor venous access post Port-A-Cath placement. Initial encounter.  EXAM: PORTABLE CHEST - 1 VIEW  COMPARISON:  None.  FINDINGS: Left central line is in place. Tip at the level of the mid superior vena cava at the level of the azygos vein.  No gross pneumothorax.  No infiltrate or congestive heart failure.  Heart size within normal limits.  IMPRESSION: Left central line is in place. Tip at the level of the mid superior vena cava at the level of the azygos vein.  No gross pneumothorax.   Electronically Signed   By: Genia Del M.D.   On: 03/22/2015 08:39   Dg Fluoro Guide Cv Line-no Report  03/22/2015   CLINICAL DATA:    FLOURO GUIDE CV LINE  Fluoroscopy was utilized by the requesting physician.  No radiographic  interpretation.       Assessment/Plan Delayed pneumothorax after Left subclavian vein port placement Anxiety Multiple sclerosis  Plan: 1.  Admit to Dr. Georgette Dover, he will  see her later today 2.  Advance diet as tolerated 3.  SCD's and lovenox for DVT proph 4.  Ambulate and aggressive pulmonary toilet, duonebs 5.  Aggressive pain control, robaxin 6.  Ativan for anxiety 7.  CXR in am 8.  Left chest tube to 20 suction   Nat Christen, [facility] 03/23/2015, 10:29 AM Pager: [phone]

## 2015-03-24 ENCOUNTER — Observation Stay (HOSPITAL_COMMUNITY): Payer: BLUE CROSS/BLUE SHIELD

## 2015-03-24 DIAGNOSIS — Z91048 Other nonmedicinal substance allergy status: Secondary | ICD-10-CM | POA: Diagnosis not present

## 2015-03-24 DIAGNOSIS — F419 Anxiety disorder, unspecified: Secondary | ICD-10-CM | POA: Diagnosis present

## 2015-03-24 DIAGNOSIS — Z885 Allergy status to narcotic agent status: Secondary | ICD-10-CM | POA: Diagnosis not present

## 2015-03-24 DIAGNOSIS — Z87891 Personal history of nicotine dependence: Secondary | ICD-10-CM | POA: Diagnosis not present

## 2015-03-24 DIAGNOSIS — Z915 Personal history of self-harm: Secondary | ICD-10-CM | POA: Diagnosis not present

## 2015-03-24 DIAGNOSIS — J939 Pneumothorax, unspecified: Secondary | ICD-10-CM | POA: Diagnosis present

## 2015-03-24 DIAGNOSIS — G35 Multiple sclerosis: Secondary | ICD-10-CM | POA: Diagnosis present

## 2015-03-24 DIAGNOSIS — J95811 Postprocedural pneumothorax: Secondary | ICD-10-CM | POA: Diagnosis present

## 2015-03-24 LAB — CBC
HEMATOCRIT: 31.7 % — AB (ref 36.0–46.0)
Hemoglobin: 10.3 g/dL — ABNORMAL LOW (ref 12.0–15.0)
MCH: 28.8 pg (ref 26.0–34.0)
MCHC: 32.5 g/dL (ref 30.0–36.0)
MCV: 88.5 fL (ref 78.0–100.0)
Platelets: 207 10*3/uL (ref 150–400)
RBC: 3.58 MIL/uL — ABNORMAL LOW (ref 3.87–5.11)
RDW: 14.3 % (ref 11.5–15.5)
WBC: 8.2 10*3/uL (ref 4.0–10.5)

## 2015-03-24 MED ORDER — HYDROMORPHONE HCL 1 MG/ML IJ SOLN
0.5000 mg | INTRAMUSCULAR | Status: DC | PRN
  Administered 2015-03-24 (×4): 1 mg via INTRAVENOUS
  Administered 2015-03-25: 0.5 mg via INTRAVENOUS
  Administered 2015-03-25 – 2015-03-27 (×10): 1 mg via INTRAVENOUS
  Filled 2015-03-24 (×15): qty 1

## 2015-03-24 MED ORDER — IPRATROPIUM-ALBUTEROL 0.5-2.5 (3) MG/3ML IN SOLN: 3.0000 mL | RESPIRATORY_TRACT | Status: DC | PRN

## 2015-03-24 MED ORDER — OXYCODONE HCL 5 MG PO TABS
10.0000 mg | ORAL_TABLET | ORAL | Status: DC | PRN
  Administered 2015-03-24: 10 mg via ORAL
  Filled 2015-03-24: qty 2

## 2015-03-24 NOTE — Progress Notes (Signed)
Subjective: Patient just returned from CXR No report yet - on my examination, there may be a very tiny apical residual pneumothorax Patient is having pain from her chest tube  Objective: Vital signs in last 24 hours: Temp:  [97.3 F (36.3 C)-98.4 F (36.9 C)] 98.3 F (36.8 C) (07/14 0443) Pulse Rate:  [58-104] 80 (07/14 0443) Resp:  [11-24] 20 (07/14 0443) BP: (98-129)/(48-102) 101/56 mmHg (07/14 0443) SpO2:  [95 %-100 %] 100 % (07/14 0443) Weight:  [86.9 kg (191 lb 9.3 oz)-89.8 kg (197 lb 15.6 oz)] 89.8 kg (197 lb 15.6 oz) (07/14 0443) Last BM Date:  (patient states "I dont remember. not today")  Intake/Output from previous day: 07/13 0701 - 07/14 0700 In: 1701.7 [P.O.:960; I.V.:741.7] Out: 950 [Urine:900; Chest Tube:50] Intake/Output this shift:    General appearance: alert, cooperative and no distress Resp: clear to auscultation bilaterally Chest wall: no tenderness, left chest tenderness from chest tube - no sign of air leak  Lab Results:   Recent Labs  03/23/15 0830 03/24/15 0517  WBC 14.1* 8.2  HGB 12.0 10.3*  HCT 35.9* 31.7*  PLT 229 207   BMET  Recent Labs  03/23/15 0830  NA 139  K 3.1*  CL 109  CO2 21*  GLUCOSE 100*  BUN 11  CREATININE 0.91  CALCIUM 8.7*   PT/INR No results for input(s): LABPROT, INR in the last 72 hours. ABG No results for input(s): PHART, HCO3 in the last 72 hours.  Invalid input(s): PCO2, PO2  Studies/Results: Dg Chest Port 1 View  03/23/2015   CLINICAL DATA:  Pneumothorax.  Chest tube placement.  EXAM: PORTABLE CHEST - 1 VIEW  COMPARISON:  03/23/2015 at 8:29 a.m.  FINDINGS: 5% residual left apical pneumothorax following left-sided chest tube placement.  Left power injectable Port-A-Cath tip:  SVC.  Bandlike subsegmental atelectasis at the left lung base. Cardiac and mediastinal margins appear normal.  IMPRESSION: 1. Left chest tube placement. The left pneumothorax has been dramatically reduced in size, currently about 5%  of left hemithoracic volume. 2. Subsegmental atelectasis at the left lung base.   Electronically Signed   By: Gaylyn Rong M.D.   On: 03/23/2015 10:12   Dg Chest Portable 1 View  03/23/2015   CLINICAL DATA:  Port-A-Cath placement yesterday. Shortness of breath. Coughing blood this morning.  EXAM: PORTABLE CHEST - 1 VIEW  COMPARISON:  03/22/2015  FINDINGS: Patient now has a large left pneumothorax measuring approximately 50%. The trachea remains midline. There is a left subclavian Port-A-Cath with the tip in the upper SVC region. Right lung is clear. Heart size is normal.  IMPRESSION: Large left pneumothorax without mediastinal shift.  Critical Value/emergent results were called by telephone at the time of interpretation on 03/23/2015 at 8:43 am to Dr. Alvira Monday , who verbally acknowledged these results.   Electronically Signed   By: Richarda Overlie M.D.   On: 03/23/2015 08:45   Dg Chest Port 1 View  03/22/2015   CLINICAL DATA:  24 year old female with history of multiple sclerosis and seizures with poor venous access post Port-A-Cath placement. Initial encounter.  EXAM: PORTABLE CHEST - 1 VIEW  COMPARISON:  None.  FINDINGS: Left central line is in place. Tip at the level of the mid superior vena cava at the level of the azygos vein.  No gross pneumothorax.  No infiltrate or congestive heart failure.  Heart size within normal limits.  IMPRESSION: Left central line is in place. Tip at the level of the mid superior  vena cava at the level of the azygos vein.  No gross pneumothorax.   Electronically Signed   By: Lacy Duverney M.D.   On: 03/22/2015 08:39   Dg Fluoro Guide Cv Line-no Report  03/22/2015   CLINICAL DATA:    FLOURO GUIDE CV LINE  Fluoroscopy was utilized by the requesting physician.  No radiographic  interpretation.     Anti-infectives: Anti-infectives    None      Assessment/Plan: s/p port placement complicated by pneumothorax s/p chest tube placement. Water seal today  Repeat CXR  in AM.  May be ready for removal tomorrow.     Beth Santos K. 03/24/2015

## 2015-03-25 ENCOUNTER — Inpatient Hospital Stay (HOSPITAL_COMMUNITY): Payer: BLUE CROSS/BLUE SHIELD

## 2015-03-25 MED ORDER — LORAZEPAM 1 MG PO TABS: 1.0000 mg | ORAL_TABLET | Freq: Four times a day (QID) | ORAL | Status: DC | PRN

## 2015-03-25 NOTE — Progress Notes (Signed)
Subjective: CXR - small apical ptx When patient was being transported to x-ray this morning, the atrium was knocked over a couple of times, pulling on the chest tube.  The chest tube is still in the chest, but not as far in as it was yesterday. PO pain meds are not covering her pain.  IV pain medicine is working.  Objective: Vital signs in last 24 hours: Temp:  [97.7 F (36.5 C)-98.3 F (36.8 C)] 97.8 F (36.6 C) (07/15 0956) Pulse Rate:  [63-96] 71 (07/15 0956) Resp:  [18-19] 18 (07/15 0956) BP: (97-126)/(56-79) 117/77 mmHg (07/15 0956) SpO2:  [100 %] 100 % (07/15 0956) Weight:  [88.8 kg (195 lb 12.3 oz)] 88.8 kg (195 lb 12.3 oz) (07/15 0630) Last BM Date: 03/22/15  Intake/Output from previous day: 07/14 0701 - 07/15 0700 In: 2040 [P.O.:840; I.V.:1200] Out: 40 [Chest Tube:40] Intake/Output this shift: Total I/O In: 240 [P.O.:240] Out: -   General appearance: alert, cooperative and no distress Resp: clear to auscultation bilaterally chest tube site OK  Lab Results:   Recent Labs  03/23/15 0830 03/24/15 0517  WBC 14.1* 8.2  HGB 12.0 10.3*  HCT 35.9* 31.7*  PLT 229 207   BMET  Recent Labs  03/23/15 0830  NA 139  K 3.1*  CL 109  CO2 21*  GLUCOSE 100*  BUN 11  CREATININE 0.91  CALCIUM 8.7*   PT/INR No results for input(s): LABPROT, INR in the last 72 hours. ABG No results for input(s): PHART, HCO3 in the last 72 hours.  Invalid input(s): PCO2, PO2  Studies/Results: Dg Chest 2 View  03/25/2015   CLINICAL DATA:  Follow-up of 50% left-sided pneumothorax following Port-A-Cath placement  EXAM: CHEST  2 VIEW  COMPARISON:  Chest x-ray of March 24, 2015  FINDINGS: The small caliber left-sided chest tube has been partially withdrawn with its tip now overlying the posterior aspect of the seventh rib. A small left-sided pneumothorax is visible amounting to 10% or less of the lung volume. The Port-A-Cath appliance is unchanged in position. The right lung is clear.  There is no mediastinal shift. The heart and pulmonary vascularity are unremarkable. The bony structures exhibit no acute abnormality.  IMPRESSION: Small residual left-sided pneumothorax. The chest tube tip is more inferiorly positioned as compared to yesterday's study.   Electronically Signed   By: David  [place] M.D.   On: 03/25/2015 07:50   Dg Chest 2 View  03/24/2015   CLINICAL DATA:  Pneumothorax.  EXAM: CHEST  2 VIEW  COMPARISON:  03/23/2015.  FINDINGS: Port-A-Cath noted in good anatomic position. Chest tube in stable position. Interim resolution of previously identified tiny left pneumothorax . Mediastinum and hilar structures are normal. Heart size normal. Mild left base subsegmental atelectasis. Small left pleural effusion noted on lateral view.  IMPRESSION: 1. Left chest tube in stable position. Interim resolution of tiny left apical pneumothorax. 2. Power port catheter in stable position. 3. Left lung base subsegmental atelectasis. Small left pleural effusion noted on lateral view.   Electronically Signed   By: Maisie Fus  Register   On: 03/24/2015 08:00    Anti-infectives: Anti-infectives    None      Assessment/Plan: s/p * No surgery found * Left pneumothorax after port placement  Small apical pneumothorax after 24 hours of water seal.  This may be a result of the issues with her atrium this morning, but we will place her back to suction today, repeat CXR in AM, hopefully water seal.  If CXR  looks good on Sunday morning, then the chest tube can be removed and she may be discharged in the afternoon on Sunday.    Pain management - I would suspect that after the chest tube is removed, PO pain meds will be more effective.  For now, I have removed PO pain meds from her orders, but they can be restarted after the tube is removed.  I communicated with Neurology - Dr. Everlena Cooper.  He feels that she should wait 2 weeks after surgery before having her infusion treatment.  LOS: 1 day    Beth Santos  K. 03/25/2015

## 2015-03-26 ENCOUNTER — Inpatient Hospital Stay (HOSPITAL_COMMUNITY): Payer: BLUE CROSS/BLUE SHIELD

## 2015-03-26 NOTE — Progress Notes (Signed)
Patient ID: Beth Santos, female   DOB: [birth year], 24 y.o.   MRN: [id number]  General Surgery [facility]  Subjective: Patient with left pneumothorax after infusion port placement.  Comfortable.  Playing video games.  Family at bedside.  Objective: Vital signs in last 24 hours: Temp:  [97.8 F (36.6 C)-98.8 F (37.1 C)] 98.1 F (36.7 C) (07/16 0504) Pulse Rate:  [71-88] 88 (07/16 0504) Resp:  [17-19] 17 (07/16 0504) BP: (104-132)/(52-77) 132/52 mmHg (07/16 0504) SpO2:  [100 %] 100 % (07/16 0504) Weight:  [89.5 kg (197 lb 5 oz)] 89.5 kg (197 lb 5 oz) (07/16 0233) Last BM Date: 03/22/15  Intake/Output from previous day: 07/15 0701 - 07/16 0700 In: 1920 [P.O.:720; I.V.:1200] Out: 60 [Chest Tube:60] Intake/Output this shift:    Physical Exam: HEENT - sclerae clear, mucous membranes moist Neck - soft Chest - clear bilaterally; no air leak with cough, valsalva; chest tube entry site clear and dry Cor - RRR Ext - no edema, non-tender Neuro - alert & oriented, no focal deficits  Lab Results:   Recent Labs  03/23/15 0830 03/24/15 0517  WBC 14.1* 8.2  HGB 12.0 10.3*  HCT 35.9* 31.7*  PLT 229 207   BMET  Recent Labs  03/23/15 0830  NA 139  K 3.1*  CL 109  CO2 21*  GLUCOSE 100*  BUN 11  CREATININE 0.91  CALCIUM 8.7*   PT/INR No results for input(s): LABPROT, INR in the last 72 hours. Comprehensive Metabolic Panel:    Component Value Date/Time   NA 139 03/23/2015 0830   NA 138 02/15/2015 1304   K 3.1* 03/23/2015 0830   K 3.5 02/15/2015 1304   CL 109 03/23/2015 0830   CL 106 02/15/2015 1304   CO2 21* 03/23/2015 0830   CO2 26 02/15/2015 1304   BUN 11 03/23/2015 0830   BUN 14 02/15/2015 1304   CREATININE 0.91 03/23/2015 0830   CREATININE 0.93 02/15/2015 1304   GLUCOSE 100* 03/23/2015 0830   GLUCOSE 84 02/15/2015 1304   CALCIUM 8.7* 03/23/2015 0830   CALCIUM 9.3 02/15/2015 1304   AST 17 03/23/2015 0830   AST 22 02/15/2015 1304   ALT 14 03/23/2015 0830   ALT 21 02/15/2015 1304   ALKPHOS 63 03/23/2015 0830   ALKPHOS 61 02/15/2015 1304   BILITOT 0.1* 03/23/2015 0830   BILITOT 0.4 02/15/2015 1304   PROT 6.7 03/23/2015 0830   PROT 6.9 02/15/2015 1304   ALBUMIN 3.5 03/23/2015 0830   ALBUMIN 3.9 02/15/2015 1304    Studies/Results: Dg Chest 2 View  03/26/2015   CLINICAL DATA:  Pneumothorax.  Chest tube in place.  EXAM: CHEST  2 VIEW  COMPARISON:  03/25/2015  FINDINGS: Left-sided power port tip to the superior vena cava. Left-sided chest tube unchanged in position.  Heart size is normal. Trace left pneumothorax is identified, smaller compared to previous. No significant pulmonary opacities. Trace bilateral pleural effusions.  IMPRESSION: 1. Smaller trace left pneumothorax. 2. Small bilateral pleural effusions.   Electronically Signed   By: Norva Pavlov M.D.   On: 03/26/2015 07:13   Dg Chest 2 View  03/25/2015   CLINICAL DATA:  Follow-up of 50% left-sided pneumothorax following Port-A-Cath placement  EXAM: CHEST  2 VIEW  COMPARISON:  Chest x-ray of March 24, 2015  FINDINGS: The small caliber left-sided chest tube has been partially withdrawn with its tip now overlying the posterior aspect of the seventh rib. A small left-sided pneumothorax is visible amounting to  10% or less of the lung volume. The Port-A-Cath appliance is unchanged in position. The right lung is clear. There is no mediastinal shift. The heart and pulmonary vascularity are unremarkable. The bony structures exhibit no acute abnormality.  IMPRESSION: Small residual left-sided pneumothorax. The chest tube tip is more inferiorly positioned as compared to yesterday's study.   Electronically Signed   By: David  [place] M.D.   On: 03/25/2015 07:50    Anti-infectives: Anti-infectives    None      Assessment & Plans: Left pneumothorax after infusion port placement  CXR improved with only trace PTX on left  Place chest tube to waterseal now  Repeat CXR at 3PM  today and in AM 7/17  Home when PTX resolved and chest tube removed  Beth Heckler, MD, [facility] Office: [phone]   Beth Beth Santos 03/26/2015

## 2015-03-27 ENCOUNTER — Inpatient Hospital Stay (HOSPITAL_COMMUNITY): Payer: BLUE CROSS/BLUE SHIELD

## 2015-03-27 MED ORDER — DOCUSATE SODIUM 100 MG PO CAPS: 100.0000 mg | ORAL_CAPSULE | Freq: Two times a day (BID) | ORAL | Status: DC

## 2015-03-27 MED ORDER — POLYETHYLENE GLYCOL 3350 17 G PO PACK: 17.0000 g | PACK | Freq: Every day | ORAL | Status: DC

## 2015-03-27 MED ORDER — HEPARIN SOD (PORK) LOCK FLUSH 100 UNIT/ML IV SOLN
500.0000 [IU] | INTRAVENOUS | Status: AC | PRN
  Administered 2015-03-27: 500 [IU]

## 2015-03-27 MED ORDER — HEPARIN SOD (PORK) LOCK FLUSH 100 UNIT/ML IV SOLN: 500.0000 [IU] | INTRAVENOUS | Status: DC | PRN

## 2015-03-27 NOTE — Progress Notes (Signed)
Patient ID: Beth Santos, female   DOB: 04-28-1991, 24 y.o.   MRN: [id number]  General Surgery [facility]  Subjective: Patient in bed, comfortable.  Objective: Vital signs in last 24 hours: Temp:  [98.2 F (36.8 C)-98.4 F (36.9 C)] 98.2 F (36.8 C) (07/17 0641) Pulse Rate:  [70-77] 72 (07/17 0641) Resp:  [16-17] 17 (07/17 0641) BP: (102-123)/(61-71) 123/63 mmHg (07/17 0641) SpO2:  [95 %-100 %] 100 % (07/17 0641) Last BM Date: 03/25/15  Intake/Output from previous day: 07/16 0701 - 07/17 0700 In: 480 [P.O.:480] Out: -  Intake/Output this shift:    Physical Exam: HEENT - sclerae clear, mucous membranes moist Neck - soft Chest - clear bilaterally; chest tube with no air leak - removed without complication Cor - RRR Ext - no edema, non-tender Neuro - alert & oriented, no focal deficits  Lab Results:  No results for input(s): WBC, HGB, HCT, PLT in the last 72 hours. BMET No results for input(s): NA, K, CL, CO2, GLUCOSE, BUN, CREATININE, CALCIUM in the last 72 hours. PT/INR No results for input(s): LABPROT, INR in the last 72 hours. Comprehensive Metabolic Panel:    Component Value Date/Time   NA 139 03/23/2015 0830   NA 138 02/15/2015 1304   K 3.1* 03/23/2015 0830   K 3.5 02/15/2015 1304   CL 109 03/23/2015 0830   CL 106 02/15/2015 1304   CO2 21* 03/23/2015 0830   CO2 26 02/15/2015 1304   BUN 11 03/23/2015 0830   BUN 14 02/15/2015 1304   CREATININE 0.91 03/23/2015 0830   CREATININE 0.93 02/15/2015 1304   GLUCOSE 100* 03/23/2015 0830   GLUCOSE 84 02/15/2015 1304   CALCIUM 8.7* 03/23/2015 0830   CALCIUM 9.3 02/15/2015 1304   AST 17 03/23/2015 0830   AST 22 02/15/2015 1304   ALT 14 03/23/2015 0830   ALT 21 02/15/2015 1304   ALKPHOS 63 03/23/2015 0830   ALKPHOS 61 02/15/2015 1304   BILITOT 0.1* 03/23/2015 0830   BILITOT 0.4 02/15/2015 1304   PROT 6.7 03/23/2015 0830   PROT 6.9 02/15/2015 1304   ALBUMIN 3.5 03/23/2015 0830   ALBUMIN  3.9 02/15/2015 1304    Studies/Results: Dg Chest 2 View  03/27/2015   CLINICAL DATA:  Pneumothorax on the left.  Shortness of breath.  EXAM: CHEST  2 VIEW  COMPARISON:  03/26/2015  FINDINGS: Left-sided power port tip overlies the level of superior vena cava. Left-sided chest tube in place, unchanged in position. No significant pneumothorax identified. Lungs are clear.  IMPRESSION: No visible pneumothorax.   Electronically Signed   By: Norva Pavlov M.D.   On: 03/27/2015 07:37   Dg Chest 2 View  03/26/2015   CLINICAL DATA:  Left pneumothorax, chest tube to water seal  EXAM: CHEST  2 VIEW  COMPARISON:  03/26/2015 at 0429 hr  FINDINGS: Indwelling left chest tube.  No definite pneumothorax is seen.  Lungs are essentially clear. No focal consolidation. No pleural effusion.  The heart is normal in size.  Left chest power port terminates in the lower SVC.  IMPRESSION: Indwelling left chest tube.  No definite pneumothorax is seen.   Electronically Signed   By: Charline Bills M.D.   On: 03/26/2015 17:37   Dg Chest 2 View  03/26/2015   CLINICAL DATA:  Pneumothorax.  Chest tube in place.  EXAM: CHEST  2 VIEW  COMPARISON:  03/25/2015  FINDINGS: Left-sided power port tip to the superior vena cava. Left-sided chest tube unchanged  in position.  Heart size is normal. Trace left pneumothorax is identified, smaller compared to previous. No significant pulmonary opacities. Trace bilateral pleural effusions.  IMPRESSION: 1. Smaller trace left pneumothorax. 2. Small bilateral pleural effusions.   Electronically Signed   By: Norva Pavlov M.D.   On: 03/26/2015 07:13    Anti-infectives: Anti-infectives    None      Assessment & Plans: Left pneumothorax after infusion port placement CXR with no residual PTX x 2 Chest tube removed this AM by MD  Follow up CXR at 11 AM today - if no PTX, discharge home today  Discontinue IVF and de-access port  [name], MD, [facility] Office: [phone]   Aaronjames Kelsay Judie Petit 03/27/2015

## 2015-03-27 NOTE — Progress Notes (Signed)
Pt ready for DC.  Copy of DC instructions given and reviewed.  Rx for colace given and reviewed.  Follow up Dr. Corliss Skains for 2 weeks.

## 2015-03-28 NOTE — Discharge Summary (Signed)
Physician Discharge Summary  Patient ID: Beth Santos MRN: [id number] DOB/AGE: [birth year] 23 y.o.  Admit date: 03/23/2015 Discharge date: 03/27/2015  Admitting Diagnosis: pneumothorax  Discharge Diagnosis Patient Active Problem List   Diagnosis Date Noted  . Pneumothorax 03/23/2015  . Relapsing remitting multiple sclerosis 02/15/2015  . Optic neuritis 07/28/2014  . Vision loss of left eye     Consultants none  Imaging: Dg Chest 2 View  03/27/2015   CLINICAL DATA:  Status post chest tube removal.  EXAM: CHEST  2 VIEW  COMPARISON:  03/27/2015  FINDINGS: There has been interval removal of the left chest tube. No pneumothorax identified. Left chest wall port a catheter is noted with tip in the projection of the SVC. Normal heart size. No pleural effusion or edema.  IMPRESSION: 1. No pneumothorax after removal of the left chest tube.   Electronically Signed   By: Signa Kell M.D.   On: 03/27/2015 10:28   Dg Chest 2 View  03/27/2015   CLINICAL DATA:  Pneumothorax on the left.  Shortness of breath.  EXAM: CHEST  2 VIEW  COMPARISON:  03/26/2015  FINDINGS: Left-sided power port tip overlies the level of superior vena cava. Left-sided chest tube in place, unchanged in position. No significant pneumothorax identified. Lungs are clear.  IMPRESSION: No visible pneumothorax.   Electronically Signed   By: Norva Pavlov M.D.   On: 03/27/2015 07:37   Dg Chest 2 View  03/26/2015   CLINICAL DATA:  Left pneumothorax, chest tube to water seal  EXAM: CHEST  2 VIEW  COMPARISON:  03/26/2015 at 0429 hr  FINDINGS: Indwelling left chest tube.  No definite pneumothorax is seen.  Lungs are essentially clear. No focal consolidation. No pleural effusion.  The heart is normal in size.  Left chest power port terminates in the lower SVC.  IMPRESSION: Indwelling left chest tube.  No definite pneumothorax is seen.   Electronically Signed   By: Charline Bills M.D.   On: 03/26/2015 17:37    HPI:  24 y/o  white female with multiple sclerosis and anxiety who presents to [facility] with left sided chest pain and SOB since yesterday after her port-a-cath placement by Dr. Corliss Skains. She was referred for port-a-cath by Dr. Everlena Cooper of Neurology because she is being treated with IV infusions of Tysabri for MS. Post-op CXR showed no gross pneumothorax. She came to the ED because the pain, DOE, and SOB progressively worsened. A CT scan was obtained and showed a 50% pneumothorax on the left. No headache, no N/V/D or abdominal pain. No fever/chills. No other complaints.     Procedures Chest tube placement---Dr. Lindie Spruce 03/23/15  Hospital Course:  She had a chest tube placed and was admitted to the floor.  Chest tube was weaned from suction to seal. A follow up CXR showed a trace PTX on the left.  Chest tube was removed and a follow up CXR did not show a PTX.  She otherwise remained stable.  She was therefore felt stable for discharge home.  She is to follow up with Dr. Corliss Skains.  Encouraged to call with questions or concerns.      Medication List    TAKE these medications        docusate sodium 100 MG capsule  Commonly known as:  COLACE  Take 1 capsule (100 mg total) by mouth 2 (two) times daily.     HYDROcodone-acetaminophen 5-325 MG per tablet  Commonly known as:  NORCO/VICODIN  Take 1 tablet by mouth  every 4 (four) hours as needed.     phentermine 37.5 MG capsule  Take 37.5 mg by mouth every morning.     polyethylene glycol packet  Commonly known as:  MIRALAX / GLYCOLAX  Take 17 g by mouth daily.     SPRINTEC 28 0.25-35 MG-MCG tablet  Generic drug:  norgestimate-ethinyl estradiol  Take 1 tablet by mouth daily.     topiramate 50 MG tablet  Commonly known as:  TOPAMAX  Take 50 mg by mouth every morning.     TYSABRI IV  Inject 300 mg into the vein every 28 (twenty-eight) days.             Follow-up Information    Follow up with Wynona Luna., MD. Schedule an appointment as soon as  possible for a visit in 2 weeks.   Specialty:  General Surgery   Contact information:   [address] [place] [address] [phone]       Signed: Ashok Norris, [facility] [phone]  03/28/2015, 8:47 AM

## 2015-03-30 ENCOUNTER — Telehealth: Payer: Self-pay | Admitting: *Deleted

## 2015-03-30 ENCOUNTER — Ambulatory Visit: Payer: BLUE CROSS/BLUE SHIELD | Admitting: Neurology

## 2015-03-30 NOTE — Telephone Encounter (Signed)
I spoke with patient she will call the office after her 2 week follow up from surgery and we will mover forward with her MS infusion

## 2015-04-15 ENCOUNTER — Other Ambulatory Visit: Payer: Self-pay | Admitting: Neurology

## 2015-04-15 ENCOUNTER — Other Ambulatory Visit: Payer: Self-pay | Admitting: Surgery

## 2015-04-15 ENCOUNTER — Telehealth: Payer: Self-pay | Admitting: Neurology

## 2015-04-15 DIAGNOSIS — G35 Multiple sclerosis: Secondary | ICD-10-CM

## 2015-04-15 MED ORDER — HEPARIN SOD (PORK) LOCK FLUSH 100 UNIT/ML IV SOLN
500.0000 [IU] | Freq: Once | INTRAVENOUS | Status: DC
Start: 2015-04-15 — End: 2015-04-15

## 2015-04-15 MED ORDER — NATALIZUMAB 300 MG/15ML IV CONC: 300.0000 mg | Freq: Once | INTRAVENOUS | Status: DC

## 2015-04-15 NOTE — Progress Notes (Signed)
The left subclavian vein port is ready for use at any time.  Wilmon Arms. Corliss Skains, MD, [facility]  General/ Trauma Surgery  04/15/2015 11:04 AM

## 2015-04-15 NOTE — Telephone Encounter (Signed)
Pls send order, thanks

## 2015-04-15 NOTE — Telephone Encounter (Signed)
Orders entered and confirmed with [facility] they were received.

## 2015-04-15 NOTE — H&P (Signed)
  Her left subclavian vein port is in good position and is available for use at any time.  No residual effects from her left pneumothorax.  Wilmon Arms. Corliss Skains, MD, [facility]  General/ Trauma Surgery  04/15/2015 11:24 AM

## 2015-04-15 NOTE — Telephone Encounter (Signed)
Spoke with [place] at [facility]. Patient is overdue for her Tysabri injection due to them being unable to get an IV inserted at the last attempt. Patient now has a port a cath and they can do this infusion on Monday. Patient is feeling bad and wants to proceed asap with injection. Geraldine Contras needs an order for Tysabri and Heparin flush (500 units) in order to get patient scheduled for Monday. She can be reached at [phone] and fax number [phone]. Please advise.

## 2015-04-18 ENCOUNTER — Encounter (HOSPITAL_COMMUNITY): Payer: Self-pay

## 2015-04-18 ENCOUNTER — Telehealth: Payer: Self-pay | Admitting: *Deleted

## 2015-04-18 ENCOUNTER — Encounter (HOSPITAL_COMMUNITY)
Admission: RE | Admit: 2015-04-18 | Discharge: 2015-04-18 | Disposition: A | Discharge status: Home / Self Care | Payer: BLUE CROSS/BLUE SHIELD | Source: Ambulatory Visit | Attending: Neurology | Admitting: Neurology

## 2015-04-18 DIAGNOSIS — G35 Multiple sclerosis: Secondary | ICD-10-CM | POA: Insufficient documentation

## 2015-04-18 MED ORDER — SODIUM CHLORIDE 0.9 % IJ SOLN
10.0000 mL | INTRAMUSCULAR | Status: DC | PRN
  Administered 2015-04-18: 10 mL
  Filled 2015-04-18: qty 10

## 2015-04-18 MED ORDER — DIPHENHYDRAMINE HCL 25 MG PO CAPS
25.0000 mg | ORAL_CAPSULE | ORAL | Status: DC
  Administered 2015-04-18: 25 mg via ORAL
  Filled 2015-04-18: qty 1

## 2015-04-18 MED ORDER — HEPARIN SOD (PORK) LOCK FLUSH 100 UNIT/ML IV SOLN
500.0000 [IU] | Freq: Once | INTRAVENOUS | Status: AC
  Administered 2015-04-18: 500 [IU] via INTRAVENOUS
  Filled 2015-04-18: qty 5

## 2015-04-18 MED ORDER — ACETAMINOPHEN 325 MG PO TABS
650.0000 mg | ORAL_TABLET | Freq: Four times a day (QID) | ORAL | Status: DC | PRN
  Administered 2015-04-18: 650 mg via ORAL
  Filled 2015-04-18: qty 2

## 2015-04-18 MED ORDER — SODIUM CHLORIDE 0.9 % IV SOLN
300.0000 mg | Freq: Once | INTRAVENOUS | Status: AC
  Administered 2015-04-18: 300 mg via INTRAVENOUS
  Filled 2015-04-18: qty 15

## 2015-04-18 MED ORDER — SODIUM CHLORIDE 0.9 % IV SOLN
INTRAVENOUS | Status: DC
  Administered 2015-04-18: 10:00:00 via INTRAVENOUS

## 2015-04-18 NOTE — Discharge Instructions (Signed)

## 2015-04-18 NOTE — Telephone Encounter (Signed)
Beth Santos from [facility] called to state patient had her tysabri today . I advised patient to contact office before starting her tysabri per protocol it is recommend to wait for 2 weeks after discharge for the healing . Patient did not call me after seeing Dr Margaree Mackintosh

## 2015-04-18 NOTE — Progress Notes (Addendum)
Patient's stated she was seen in office by Dr. Corliss Skains on Friday 04/15/15. Since seeing Dr. Corliss Skains, a small raised bump with white center developed lateral to the dome of the port. No drainage noted and patient is afebrile. Dr. Fatima Sanger office was called. He is on vacation this week. Dr. Gerrit Friends was paged (on call doctor). OK to use port as long as access needle is not inserted at raised bump and is removed after infusion. Instructed patient to follow up with Dr. Fatima Sanger nurse post-infusion per Dr. Gerrit Friends. Patient verbalizes understanding.

## 2015-04-21 ENCOUNTER — Encounter (HOSPITAL_COMMUNITY): Payer: BLUE CROSS/BLUE SHIELD

## 2015-05-23 ENCOUNTER — Encounter (HOSPITAL_COMMUNITY)
Admission: RE | Admit: 2015-05-23 | Discharge: 2015-05-23 | Disposition: A | Discharge status: Home / Self Care | Payer: BLUE CROSS/BLUE SHIELD | Source: Ambulatory Visit | Attending: Neurology | Admitting: Neurology

## 2015-05-23 ENCOUNTER — Encounter (INDEPENDENT_AMBULATORY_CARE_PROVIDER_SITE_OTHER): Payer: Self-pay

## 2015-05-23 ENCOUNTER — Encounter (HOSPITAL_COMMUNITY): Payer: Self-pay

## 2015-05-23 DIAGNOSIS — G35 Multiple sclerosis: Secondary | ICD-10-CM | POA: Insufficient documentation

## 2015-05-23 MED ORDER — SODIUM CHLORIDE 0.9 % IV SOLN
300.0000 mg | INTRAVENOUS | Status: DC
  Administered 2015-05-23: 300 mg via INTRAVENOUS
  Filled 2015-05-23: qty 15

## 2015-05-23 MED ORDER — DIPHENHYDRAMINE HCL 25 MG PO CAPS
25.0000 mg | ORAL_CAPSULE | ORAL | Status: DC
  Administered 2015-05-23: 25 mg via ORAL
  Filled 2015-05-23: qty 1

## 2015-05-23 MED ORDER — HEPARIN SOD (PORK) LOCK FLUSH 100 UNIT/ML IV SOLN
500.0000 [IU] | INTRAVENOUS | Status: DC | PRN
  Administered 2015-05-23: 500 [IU]
  Filled 2015-05-23: qty 5

## 2015-05-23 MED ORDER — ACETAMINOPHEN 325 MG PO TABS
650.0000 mg | ORAL_TABLET | ORAL | Status: DC
  Administered 2015-05-23: 650 mg via ORAL
  Filled 2015-05-23: qty 2

## 2015-05-23 MED ORDER — SODIUM CHLORIDE 0.9 % IV SOLN
INTRAVENOUS | Status: DC
  Administered 2015-05-23: 250 mL via INTRAVENOUS

## 2015-05-23 MED ORDER — SODIUM CHLORIDE 0.9 % IJ SOLN
10.0000 mL | INTRAMUSCULAR | Status: DC | PRN
  Administered 2015-05-23: 10 mL
  Filled 2015-05-23: qty 10

## 2015-05-26 ENCOUNTER — Encounter (HOSPITAL_COMMUNITY): Payer: BLUE CROSS/BLUE SHIELD

## 2015-05-30 ENCOUNTER — Other Ambulatory Visit: Payer: Self-pay

## 2015-05-30 DIAGNOSIS — G35 Multiple sclerosis: Secondary | ICD-10-CM

## 2015-05-30 LAB — COMPREHENSIVE METABOLIC PANEL
ALT: 12 U/L (ref 6–29)
AST: 15 U/L (ref 10–30)
Albumin: 4 g/dL (ref 3.6–5.1)
Alkaline Phosphatase: 64 U/L (ref 33–115)
BUN: 13 mg/dL (ref 7–25)
CHLORIDE: 105 mmol/L (ref 98–110)
CO2: 27 mmol/L (ref 20–31)
Calcium: 9 mg/dL (ref 8.6–10.2)
Creat: 0.66 mg/dL (ref 0.50–1.10)
GLUCOSE: 90 mg/dL (ref 65–99)
POTASSIUM: 4.1 mmol/L (ref 3.5–5.3)
Sodium: 140 mmol/L (ref 135–146)
Total Bilirubin: 0.3 mg/dL (ref 0.2–1.2)
Total Protein: 6.6 g/dL (ref 6.1–8.1)

## 2015-05-30 LAB — CBC WITH DIFFERENTIAL/PLATELET
BASOS ABS: 0 10*3/uL (ref 0.0–0.1)
Basophils Relative: 0 % (ref 0–1)
EOS ABS: 0.2 10*3/uL (ref 0.0–0.7)
EOS PCT: 2 % (ref 0–5)
HCT: 36.5 % (ref 36.0–46.0)
Hemoglobin: 12.2 g/dL (ref 12.0–15.0)
LYMPHS ABS: 5.6 10*3/uL — AB (ref 0.7–4.0)
Lymphocytes Relative: 54 % — ABNORMAL HIGH (ref 12–46)
MCH: 28 pg (ref 26.0–34.0)
MCHC: 33.4 g/dL (ref 30.0–36.0)
MCV: 83.9 fL (ref 78.0–100.0)
MONOS PCT: 8 % (ref 3–12)
MPV: 11.1 fL (ref 8.6–12.4)
Monocytes Absolute: 0.8 10*3/uL (ref 0.1–1.0)
Neutro Abs: 3.7 10*3/uL (ref 1.7–7.7)
Neutrophils Relative %: 36 % — ABNORMAL LOW (ref 43–77)
PLATELETS: 277 10*3/uL (ref 150–400)
RBC: 4.35 MIL/uL (ref 3.87–5.11)
RDW: 14.7 % (ref 11.5–15.5)
WBC: 10.3 10*3/uL (ref 4.0–10.5)

## 2015-05-31 ENCOUNTER — Ambulatory Visit (INDEPENDENT_AMBULATORY_CARE_PROVIDER_SITE_OTHER): Payer: BLUE CROSS/BLUE SHIELD | Admitting: Neurology

## 2015-05-31 ENCOUNTER — Ambulatory Visit: Payer: BLUE CROSS/BLUE SHIELD | Admitting: Neurology

## 2015-05-31 ENCOUNTER — Encounter: Payer: Self-pay | Admitting: Neurology

## 2015-05-31 VITALS — BP 106/60 | HR 75 | Temp 98.4°F | Resp 16 | Ht 70.0 in | Wt 195.7 lb

## 2015-05-31 DIAGNOSIS — G35 Multiple sclerosis: Secondary | ICD-10-CM

## 2015-05-31 MED ORDER — LIDOCAINE 5 % EX OINT: 1.0000 "application " | TOPICAL_OINTMENT | CUTANEOUS | Status: DC | PRN

## 2015-05-31 NOTE — Progress Notes (Addendum)
NEUROLOGY FOLLOW UP OFFICE NOTE  Christina Gintz [id number]  HISTORY OF PRESENT ILLNESS: Beth Santos is a 24 year old right-handed woman with no significant past medical history who follows up for relapsing-remitting MS.  MRI of brain and cervical spine in June reviewed.  Report from Dr. Manus Rudd, who placed the port, reviewed.  UPDATE: She started Tysabri in March.  Repeat MRI of the brain and cervical spine with and without contrast performed on 03/03/15 showed new 12 mm non-enhancing lesion in left parietal lobe and possible new tiny nonenhancing lesion at the C7 level of spinal cord.  She had trouble receiving Tysabri because they could not get a line.  She had a port catheter placed on 03/22/15, however she developed a pneumothorax.  She subsequently healed and the left subclavian vein port was good and available for use.  She resumed Tysabri and had her last dose on 05/23/15.  She reports it is uncomfortable on the skin when they stick the port.  She reports that she sometimes trips, but is otherwise doing okay.  CBC CMP, JCV antibody are still pending.  HISTORY: On 07/27/14, she had left optic neuritis.  She had an MRI of the brain with and without contrast, which showed multiple hyperdense non-enhancing lesions in the supratentorial white matter with 5 mm enhancing lesion in the left occipital lobe.  There was associated asymmetric enhancement of the left optic nerve.  Labs showed ANA negative, Sed Rate 18, RPR non-reactive, TSH 6.060, free T4 1, HIV 1&2 antibodies non-reactive, and unremarkable CBC with diff.  She was treated with Solumedrol  for 5 days.  Vision improved.  She has a history numbness in the left leg off and on for some time.  She also will sometimes experience tingling in the right foot.  She notes cold may exacerbate symptoms.  She reports feeling tired but no significant fatigue.  She occasionally notes twitching in her left temple.  She denies muscle cramps or  gait instability.  She does not have family history of MS.  She started Tysabri in March.  Labs from 09/15/14 showed JCV antibody negative.  MRI of the cervical spine with and without contrast performed on 10/01/14 showed subtle T2 hyperintense lesions at the level of C3-4 and C5, without enhancement.  PAST MEDICAL HISTORY: Past Medical History  Diagnosis Date  . Multiple sclerosis   . History of seizure     age 76 - due to intentional overdose of multiple medications  . History of suicide attempt as a teenager  . History of dislocation     of jaw  . Dental crown present     right upper  . Poor venous access   . Depression   . Anxiety     MEDICATIONS: Current Outpatient Prescriptions on File Prior to Visit  Medication Sig Dispense Refill  . Natalizumab (TYSABRI IV) Inject 300 mg into the vein every 28 (twenty-eight) days.    . norgestimate-ethinyl estradiol (SPRINTEC 28) 0.25-35 MG-MCG tablet Take 1 tablet by mouth daily.    Marland Kitchen docusate sodium (COLACE) 100 MG capsule Take 1 capsule (100 mg total) by mouth 2 (two) times daily. (Patient not taking: Reported on 05/31/2015) 10 capsule 0  . HYDROcodone-acetaminophen (NORCO/VICODIN) 5-325 MG per tablet Take 1 tablet by mouth every 4 (four) hours as needed. (Patient not taking: Reported on 05/31/2015) 40 tablet 0  . phentermine 37.5 MG capsule Take 37.5 mg by mouth every morning.    . polyethylene glycol (MIRALAX /  GLYCOLAX) packet Take 17 g by mouth daily. (Patient not taking: Reported on 05/31/2015) 14 each 0  . topiramate (TOPAMAX) 50 MG tablet Take 50 mg by mouth every morning.     No current facility-administered medications on file prior to visit.    ALLERGIES: Allergies  Allergen Reactions  . Oxycodone-Acetaminophen Nausea And Vomiting  . Other     No antibiotics as it conflicts with therapy Stainless Steel- RASH    FAMILY HISTORY: Family History  Problem Relation Age of Onset  . Diabetes Father   . Rheum arthritis Mother    . Cancer Maternal Grandmother     renal cell   . Diabetes Maternal Grandfather   . Hypertension Maternal Grandfather   . Alzheimer's disease Maternal Grandfather   . Cancer Paternal Grandmother     lung    SOCIAL HISTORY: Social History   Social History  . Marital Status: Married    Spouse Name: N/A  . Number of Children: N/A  . Years of Education: N/A   Occupational History  . Not on file.   Social History Main Topics  . Smoking status: Former Smoker -- 0.00 packs/day    Quit date: 07/10/2013  . Smokeless tobacco: Never Used  . Alcohol Use: 0.0 oz/week    0 Standard drinks or equivalent per week     Comment: occasionally  . Drug Use: No  . Sexual Activity:    Partners: Male    Birth Control/ Protection: None   Other Topics Concern  . Not on file   Social History Narrative    REVIEW OF SYSTEMS: Constitutional: No fevers, chills, or sweats, no generalized fatigue, change in appetite Eyes: No visual changes, double vision, eye pain Ear, nose and throat: No hearing loss, ear pain, nasal congestion, sore throat Cardiovascular: No chest pain, palpitations Respiratory:  No shortness of breath at rest or with exertion, wheezes GastrointestinaI: No nausea, vomiting, diarrhea, abdominal pain, fecal incontinence Genitourinary:  No dysuria, urinary retention or frequency Musculoskeletal:  No neck pain, back pain Integumentary: No rash, pruritus, skin lesions Neurological: as above Psychiatric: No depression, insomnia, anxiety Endocrine: No palpitations, fatigue, diaphoresis, mood swings, change in appetite, change in weight, increased thirst Hematologic/Lymphatic:  No anemia, purpura, petechiae. Allergic/Immunologic: no itchy/runny eyes, nasal congestion, recent allergic reactions, rashes  PHYSICAL EXAM: Filed Vitals:   05/31/15 1227  BP: 106/60  Pulse: 75  Temp: 98.4 F (36.9 C)  Resp: 16   General: No acute distress.   Head:  Normocephalic/atraumatic Eyes:   Fundoscopic exam unremarkable without vessel changes, exudates, hemorrhages or papilledema. Neck: supple, no paraspinal tenderness, full range of motion Heart:  Regular rate and rhythm Lungs:  Clear to auscultation bilaterally Back: No paraspinal tenderness Neurological Exam: alert and oriented to person, place, and time. Attention span and concentration intact, recent and remote memory intact, fund of knowledge intact.  Speech fluent and not dysarthric, language intact.  left V1-V2 facial sensory loss, otherwise CN II-XII intact. Fundoscopic exam unremarkable without vessel changes, exudates, hemorrhages or papilledema.  Bulk and tone normal, muscle strength 5/5 throughout.  Decreased pinprick in left upper and lower extremities and reduced vibration in right foot.  Deep tendon reflexes 2+ throughout, toes downgoing.  Finger to nose and heel to shin testing intact.  Gait normal with Timed 25 Foot Walk of 4.55 seconds, able to  tandem walk, Romberg negative.  IMPRESSION: Relapsing-remitting MS  PLAN: Continue Tysabri Awaiting lab results for CBC with diff, CMP and JC virus antibody.  Repeat these tests in December prior to follow up.  ADDENDUM:  Labs from 05/30/15:  WBC 10.3, Absolute lymphocyte count 5.6 K/uL, LFT normal, JCV ab negative.  Repeat MRI of brain and cervical spine without and with contrast in December prior to follow up Lidocaine 5% ointment to apply to skin over the port when being used. Follow up in December.  Shon Millet, DO

## 2015-05-31 NOTE — Patient Instructions (Addendum)
1.  Continue Tysabri 2.  Repeat CBC with diff, CMP and JCV antibody in December. Your provider has requested that you have labwork completed. Please go to [facility] on the second floor of this building. You will need to check in with our office first so we can schedule your lab appt. If you are not called within 15 minutes please check with the front desk.  3.  Repeat MRI of brain and cervical spine with and without contrast in December. We have scheduled you at [facility] for your MRI on 08/15/2015 at 3:00 pm. Please arrive 15 minutes prior and go to 1st floor radiology. If you need to reschedule for any reason please call [phone]. 4.  Apply lidocaine 5% ointment on area of port for pain relief 5.  Follow up in December after tests performed.

## 2015-06-03 LAB — STRATIFY JCV ANTIBODY ELISA W/RFLX TO INHIBITION ASSAY

## 2015-06-03 LAB — STRATIFY JCV AB INHIBITION (REFLEX)

## 2015-06-20 ENCOUNTER — Encounter (HOSPITAL_COMMUNITY): Payer: BLUE CROSS/BLUE SHIELD

## 2015-06-20 ENCOUNTER — Encounter (HOSPITAL_COMMUNITY): Admission: RE | Admit: 2015-06-20 | Payer: BLUE CROSS/BLUE SHIELD | Source: Ambulatory Visit

## 2015-06-27 ENCOUNTER — Encounter (HOSPITAL_COMMUNITY): Payer: Self-pay

## 2015-06-27 ENCOUNTER — Encounter (HOSPITAL_COMMUNITY)
Admission: RE | Admit: 2015-06-27 | Discharge: 2015-06-27 | Disposition: A | Discharge status: Home / Self Care | Payer: BLUE CROSS/BLUE SHIELD | Source: Ambulatory Visit | Attending: Neurology | Admitting: Neurology

## 2015-06-27 DIAGNOSIS — G35 Multiple sclerosis: Secondary | ICD-10-CM | POA: Diagnosis present

## 2015-06-27 MED ORDER — SODIUM CHLORIDE 0.9 % IV SOLN
300.0000 mg | INTRAVENOUS | Status: DC
  Administered 2015-06-27: 300 mg via INTRAVENOUS
  Filled 2015-06-27: qty 15

## 2015-06-27 MED ORDER — SODIUM CHLORIDE 0.9 % IV SOLN
INTRAVENOUS | Status: DC
  Administered 2015-06-27: 250 mL via INTRAVENOUS

## 2015-06-27 MED ORDER — DIPHENHYDRAMINE HCL 25 MG PO CAPS
25.0000 mg | ORAL_CAPSULE | ORAL | Status: DC
  Administered 2015-06-27: 25 mg via ORAL
  Filled 2015-06-27: qty 1

## 2015-06-27 MED ORDER — SODIUM CHLORIDE 0.9 % IJ SOLN
10.0000 mL | INTRAMUSCULAR | Status: DC | PRN
  Administered 2015-06-27: 10 mL
  Filled 2015-06-27: qty 10

## 2015-06-27 MED ORDER — ACETAMINOPHEN 325 MG PO TABS
650.0000 mg | ORAL_TABLET | ORAL | Status: DC
  Administered 2015-06-27: 650 mg via ORAL
  Filled 2015-06-27: qty 2

## 2015-06-27 MED ORDER — HEPARIN SOD (PORK) LOCK FLUSH 100 UNIT/ML IV SOLN
500.0000 [IU] | INTRAVENOUS | Status: DC | PRN
  Administered 2015-06-27: 500 [IU]
  Filled 2015-06-27: qty 5

## 2015-07-06 ENCOUNTER — Encounter: Payer: Self-pay | Admitting: Neurology

## 2015-07-22 ENCOUNTER — Other Ambulatory Visit: Payer: Self-pay | Admitting: Neurology

## 2015-07-22 DIAGNOSIS — G35 Multiple sclerosis: Secondary | ICD-10-CM

## 2015-07-22 MED ORDER — HEPARIN SOD (PORK) LOCK FLUSH 100 UNIT/ML IV SOLN: 500.0000 [IU] | Freq: Once | INTRAVENOUS | Status: DC

## 2015-07-22 MED ORDER — SODIUM CHLORIDE 0.9 % IV SOLN: 300.0000 mg | INTRAVENOUS | Status: DC

## 2015-07-22 NOTE — Addendum Note (Signed)
Addended by: Sheilah Mins A on: 07/22/2015 03:38 PM   Modules accepted: Orders

## 2015-07-25 ENCOUNTER — Encounter (HOSPITAL_COMMUNITY)
Admission: RE | Admit: 2015-07-25 | Discharge: 2015-07-25 | Disposition: A | Discharge status: Home / Self Care | Payer: BLUE CROSS/BLUE SHIELD | Source: Ambulatory Visit | Attending: Neurology | Admitting: Neurology

## 2015-07-25 DIAGNOSIS — G35 Multiple sclerosis: Secondary | ICD-10-CM | POA: Insufficient documentation

## 2015-08-15 ENCOUNTER — Ambulatory Visit (HOSPITAL_COMMUNITY)
Admission: RE | Admit: 2015-08-15 | Discharge: 2015-08-15 | Disposition: A | Discharge status: Home / Self Care | Payer: BLUE CROSS/BLUE SHIELD | Source: Ambulatory Visit | Attending: Neurology | Admitting: Neurology

## 2015-08-15 DIAGNOSIS — R253 Fasciculation: Secondary | ICD-10-CM | POA: Diagnosis not present

## 2015-08-15 DIAGNOSIS — G35 Multiple sclerosis: Secondary | ICD-10-CM | POA: Insufficient documentation

## 2015-08-15 DIAGNOSIS — R5383 Other fatigue: Secondary | ICD-10-CM | POA: Diagnosis not present

## 2015-08-15 DIAGNOSIS — G939 Disorder of brain, unspecified: Secondary | ICD-10-CM | POA: Insufficient documentation

## 2015-08-15 DIAGNOSIS — R200 Anesthesia of skin: Secondary | ICD-10-CM | POA: Insufficient documentation

## 2015-08-15 DIAGNOSIS — R202 Paresthesia of skin: Secondary | ICD-10-CM | POA: Insufficient documentation

## 2015-08-16 ENCOUNTER — Telehealth: Payer: Self-pay

## 2015-08-16 DIAGNOSIS — G35 Multiple sclerosis: Secondary | ICD-10-CM

## 2015-08-16 NOTE — Telephone Encounter (Signed)
Message relayed to patient. Verbalized understanding and denied questions.   

## 2015-08-16 NOTE — Telephone Encounter (Signed)
Pt currently scheduled for - 12/12/15 @ 3:30.  JCV and LFTs future orders placed. Reminder set.

## 2015-08-16 NOTE — Telephone Encounter (Signed)
-----   Message from Drema Dallas, DO sent at 08/16/2015  7:38 AM EST ----- MRIs show no change compared to prior MRIs.  Disease appears stable.  I would like to repeat LFTs and JC Virus antibody WITH INDEX in 3 months.  I would like her to make an appointment with me in 3 months, about a week after the blood tests are performed.

## 2015-08-17 MED ORDER — GADOBENATE DIMEGLUMINE 529 MG/ML IV SOLN
19.0000 mL | Freq: Once | INTRAVENOUS | Status: AC | PRN
  Administered 2015-08-15: 19 mL via INTRAVENOUS

## 2015-08-22 ENCOUNTER — Encounter (HOSPITAL_COMMUNITY): Payer: Self-pay

## 2015-08-22 ENCOUNTER — Encounter (HOSPITAL_COMMUNITY)
Admission: RE | Admit: 2015-08-22 | Discharge: 2015-08-22 | Disposition: A | Discharge status: Home / Self Care | Payer: BLUE CROSS/BLUE SHIELD | Source: Ambulatory Visit | Attending: Neurology | Admitting: Neurology

## 2015-08-22 ENCOUNTER — Other Ambulatory Visit: Payer: Self-pay | Admitting: Neurology

## 2015-08-22 VITALS — BP 107/63 | HR 73 | Temp 97.8°F | Resp 16 | Ht 70.0 in | Wt 196.0 lb

## 2015-08-22 DIAGNOSIS — G35 Multiple sclerosis: Secondary | ICD-10-CM | POA: Diagnosis present

## 2015-08-22 MED ORDER — SODIUM CHLORIDE 0.9 % IJ SOLN
10.0000 mL | INTRAMUSCULAR | Status: DC | PRN
  Administered 2015-08-22: 10 mL
  Filled 2015-08-22: qty 10

## 2015-08-22 MED ORDER — SODIUM CHLORIDE 0.9 % IV SOLN
300.0000 mg | Freq: Once | INTRAVENOUS | Status: AC
  Administered 2015-08-22: 300 mg via INTRAVENOUS
  Filled 2015-08-22: qty 15

## 2015-08-22 MED ORDER — SODIUM CHLORIDE 0.9 % IV SOLN
INTRAVENOUS | Status: DC
  Administered 2015-08-22: 250 mL via INTRAVENOUS

## 2015-08-22 MED ORDER — HEPARIN SOD (PORK) LOCK FLUSH 100 UNIT/ML IV SOLN
500.0000 [IU] | Freq: Once | INTRAVENOUS | Status: AC
  Administered 2015-08-22: 500 [IU] via INTRAVENOUS
  Filled 2015-08-22: qty 5

## 2015-08-22 NOTE — Discharge Instructions (Signed)
TYSABRI °Natalizumab injection °What is this medicine? °NATALIZUMAB (na ta LIZ you mab) is used to treat relapsing multiple sclerosis. This drug is not a cure. It is also used to treat Crohn's disease. °This medicine may be used for other purposes; ask your health care provider or pharmacist if you have questions. °What should I tell my health care provider before I take this medicine? °They need to know if you have any of these conditions: °-immune system problems °-progressive multifocal leukoencephalopathy (PML) °-an unusual or allergic reaction to natalizumab, other medicines, foods, dyes, or preservatives °-pregnant or trying to get pregnant °-breast-feeding °How should I use this medicine? °This medicine is for infusion into a vein. It is given by a health care professional in a hospital or clinic setting. °A special MedGuide will be given to you by the pharmacist with each prescription and refill. Be sure to read this information carefully each time. °Talk to your pediatrician regarding the use of this medicine in children. This medicine is not approved for use in children. °Overdosage: If you think you have taken too much of this medicine contact a poison control center or emergency room at once. °NOTE: This medicine is only for you. Do not share this medicine with others. °What if I miss a dose? °It is important not to miss your dose. Call your doctor or health care professional if you are unable to keep an appointment. °What may interact with this medicine? °-azathioprine °-cyclosporine °-interferon °-6-mercaptopurine °-methotrexate °-steroid medicines like prednisone or cortisone °-TNF-alpha inhibitors like adalimumab, etanercept, and infliximab °-vaccines °This list may not describe all possible interactions. Give your health care provider a list of all the medicines, herbs, non-prescription drugs, or dietary supplements you use. Also tell them if you smoke, drink alcohol, or use illegal drugs. Some  items may interact with your medicine. °What should I watch for while using this medicine? °Your condition will be monitored carefully while you are receiving this medicine. Visit your doctor for regular check ups. Tell your doctor or healthcare professional if your symptoms do not start to get better or if they get worse. °Stay away from people who are sick. Call your doctor or health care professional for advice if you get a fever, chills or sore throat, or other symptoms of a cold or flu. Do not treat yourself. °In some patients, this medicine may cause a serious brain infection that may cause death. If you have any problems seeing, thinking, speaking, walking, or standing, tell your doctor right away. If you cannot reach your doctor, get urgent medical care. °What side effects may I notice from receiving this medicine? °Side effects that you should report to your doctor or health care professional as soon as possible: °-allergic reactions like skin rash, itching or hives, swelling of the face, lips, or tongue °-breathing problems °-changes in vision °-chest pain °-dark urine °-depression, feelings of sadness °-dizziness °-general ill feeling or flu-like symptoms °-irregular, missed, or painful menstrual periods °-light-colored stools °-loss of appetite, nausea °-muscle weakness °-problems with balance, talking, or walking °-right upper belly pain °-unusually weak or tired °-yellowing of the eyes or skin °Side effects that usually do not require medical attention (report to your doctor or health care professional if they continue or are bothersome): °-aches, pains °-headache °-stomach upset °-tiredness °This list may not describe all possible side effects. Call your doctor for medical advice about side effects. You may report side effects to FDA at 1-800-FDA-1088. °Where should I keep my medicine? °This   drug is given in a hospital or clinic and will not be stored at home. °NOTE: This sheet is a summary. It may  not cover all possible information. If you have questions about this medicine, talk to your doctor, pharmacist, or health care provider. °  °© 2016, Elsevier/Gold Standard. (2008-10-16 13:33:21) °Multiple Sclerosis °Multiple sclerosis (MS) is a disease of the central nervous system. It leads to the loss of the insulating covering of the nerves (myelin sheath) of your brain. When this happens, brain signals do not get sent properly or may not get sent at all. The age of onset of MS varies.  °CAUSES °The cause of MS is unknown. However, it is more common in the northern [place] than in the southern [place]. °RISK FACTORS °There is a higher number of women with MS than men. MS is not an illness that is passed down to you from your family members (inherited). However, your risk of MS is higher if you have a relative with MS. °SIGNS AND SYMPTOMS  °The symptoms of MS occur in episodes or attacks. These attacks may last weeks to months. There may be long periods of almost no symptoms between attacks. The symptoms of MS vary. This is because of the many different ways it affects the central nervous system. The main symptoms of MS include: °· Vision problems and eye pain. °· Numbness. °· Weakness. °· Inability to move your arms, hands, feet, or legs (paralysis). °· Balance problems. °· Tremors. °DIAGNOSIS  °Your health care provider can diagnose MS with the help of imaging exams and lab tests. These may include specialized X-ray exams and spinal fluid tests. The best imaging exam to confirm a diagnosis of MS is an MRI. °TREATMENT  °There is no known cure for MS, but there are medicines that can decrease the number and frequency of attacks. Steroids are often used for short-term relief. Physical and occupational therapy may also help. There are also many new alternative or complementary treatments available to help control the symptoms of MS. Ask your health care provider if any of these other options are right  for you. °HOME CARE INSTRUCTIONS  °· Take medicines as directed by your health care provider. °· Exercise as directed by your health care provider. °SEEK MEDICAL CARE IF: °You begin to feel depressed. °SEEK IMMEDIATE MEDICAL CARE IF: °· You develop paralysis. °· You have problems with bladder, bowel, or sexual function. °· You develop mental changes, such as forgetfulness or mood swings. °· You have a period of uncontrolled movements (seizure). °  °This information is not intended to replace advice given to you by your health care provider. Make sure you discuss any questions you have with your health care provider. °  °Document Released: 08/24/2000 Document Revised: 09/01/2013 Document Reviewed: 05/04/2013 °Elsevier Interactive Patient Education ©2016 Elsevier Inc. ° ° °

## 2015-09-19 ENCOUNTER — Encounter (HOSPITAL_COMMUNITY): Admission: RE | Admit: 2015-09-19 | Payer: BLUE CROSS/BLUE SHIELD | Source: Ambulatory Visit

## 2015-10-03 ENCOUNTER — Encounter (HOSPITAL_COMMUNITY): Admission: RE | Admit: 2015-10-03 | Payer: BLUE CROSS/BLUE SHIELD | Source: Ambulatory Visit

## 2015-10-10 ENCOUNTER — Encounter (HOSPITAL_COMMUNITY): Payer: BLUE CROSS/BLUE SHIELD

## 2015-10-12 ENCOUNTER — Encounter (HOSPITAL_COMMUNITY)
Admission: RE | Admit: 2015-10-12 | Discharge: 2015-10-12 | Disposition: A | Discharge status: Home / Self Care | Payer: BLUE CROSS/BLUE SHIELD | Source: Ambulatory Visit | Attending: Neurology | Admitting: Neurology

## 2015-10-12 VITALS — BP 107/59 | HR 65 | Temp 98.2°F | Resp 16 | Ht 70.0 in | Wt 196.0 lb

## 2015-10-12 DIAGNOSIS — G35 Multiple sclerosis: Secondary | ICD-10-CM | POA: Diagnosis present

## 2015-10-12 MED ORDER — SODIUM CHLORIDE 0.9 % IV SOLN
INTRAVENOUS | Status: DC
  Administered 2015-10-12: 09:00:00 via INTRAVENOUS

## 2015-10-12 MED ORDER — HEPARIN SOD (PORK) LOCK FLUSH 100 UNIT/ML IV SOLN
500.0000 [IU] | INTRAVENOUS | Status: DC | PRN
  Administered 2015-10-12: 500 [IU] via INTRAVENOUS
  Filled 2015-10-12: qty 5

## 2015-10-12 MED ORDER — SODIUM CHLORIDE 0.9 % IV SOLN
300.0000 mg | INTRAVENOUS | Status: DC
  Administered 2015-10-12: 300 mg via INTRAVENOUS
  Filled 2015-10-12: qty 15

## 2015-10-12 MED ORDER — SODIUM CHLORIDE 0.9 % IJ SOLN
10.0000 mL | INTRAMUSCULAR | Status: DC | PRN
  Administered 2015-10-12: 10 mL
  Filled 2015-10-12: qty 10

## 2015-11-01 ENCOUNTER — Telehealth: Payer: Self-pay

## 2015-11-01 DIAGNOSIS — G35 Multiple sclerosis: Secondary | ICD-10-CM

## 2015-11-01 NOTE — Telephone Encounter (Signed)
Spoke w/ patient. She will come have labs drawn w/ in the next week.

## 2015-11-14 ENCOUNTER — Other Ambulatory Visit (INDEPENDENT_AMBULATORY_CARE_PROVIDER_SITE_OTHER): Payer: BLUE CROSS/BLUE SHIELD

## 2015-11-14 ENCOUNTER — Encounter (HOSPITAL_COMMUNITY)
Admission: RE | Admit: 2015-11-14 | Discharge: 2015-11-14 | Disposition: A | Discharge status: Home / Self Care | Payer: BLUE CROSS/BLUE SHIELD | Source: Ambulatory Visit | Attending: Neurology | Admitting: Neurology

## 2015-11-14 ENCOUNTER — Encounter (HOSPITAL_COMMUNITY): Payer: Self-pay

## 2015-11-14 VITALS — BP 102/61 | HR 70 | Temp 97.9°F | Resp 16 | Wt 193.0 lb

## 2015-11-14 DIAGNOSIS — G35 Multiple sclerosis: Secondary | ICD-10-CM | POA: Diagnosis not present

## 2015-11-14 LAB — CBC WITH DIFFERENTIAL/PLATELET
BASOS ABS: 0 10*3/uL (ref 0.0–0.1)
BASOS PCT: 0.5 % (ref 0.0–3.0)
EOS ABS: 0.3 10*3/uL (ref 0.0–0.7)
Eosinophils Relative: 3.9 % (ref 0.0–5.0)
HEMATOCRIT: 34.5 % — AB (ref 36.0–46.0)
Hemoglobin: 11.6 g/dL — ABNORMAL LOW (ref 12.0–15.0)
LYMPHS ABS: 3.4 10*3/uL (ref 0.7–4.0)
LYMPHS PCT: 50.6 % — AB (ref 12.0–46.0)
MCHC: 33.6 g/dL (ref 30.0–36.0)
MCV: 84.3 fl (ref 78.0–100.0)
MONO ABS: 0.4 10*3/uL (ref 0.1–1.0)
Monocytes Relative: 6.1 % (ref 3.0–12.0)
NEUTROS ABS: 2.6 10*3/uL (ref 1.4–7.7)
NEUTROS PCT: 38.9 % — AB (ref 43.0–77.0)
PLATELETS: 205 10*3/uL (ref 150.0–400.0)
RBC: 4.09 Mil/uL (ref 3.87–5.11)
RDW: 14.1 % (ref 11.5–15.5)
WBC: 6.8 10*3/uL (ref 4.0–10.5)

## 2015-11-14 MED ORDER — HEPARIN SOD (PORK) LOCK FLUSH 100 UNIT/ML IV SOLN
500.0000 [IU] | INTRAVENOUS | Status: DC | PRN
  Administered 2015-11-14: 500 [IU] via INTRAVENOUS
  Filled 2015-11-14: qty 5

## 2015-11-14 MED ORDER — SODIUM CHLORIDE 0.9 % IJ SOLN
10.0000 mL | INTRAMUSCULAR | Status: DC | PRN
  Administered 2015-11-14: 10 mL
  Filled 2015-11-14: qty 10

## 2015-11-14 MED ORDER — SODIUM CHLORIDE 0.9 % IV SOLN
INTRAVENOUS | Status: DC
  Administered 2015-11-14: 09:00:00 via INTRAVENOUS

## 2015-11-14 MED ORDER — NATALIZUMAB 300 MG/15ML IV CONC
300.0000 mg | INTRAVENOUS | Status: DC
  Administered 2015-11-14: 300 mg via INTRAVENOUS
  Filled 2015-11-14: qty 15

## 2015-11-14 NOTE — Discharge Instructions (Signed)
° °  November 14, 2015  Patient: Beth Santos  Date of Birth: [birth year]  Date of Visit: 11/14/2015    To Whom It May Concern:  Beth Santos was seen and treated in our short stay on 11/14/2015. Altamese Deguire  may return to work on 3/6  Sincerely,    Short stay staff

## 2015-11-17 LAB — STRATIFY JCV AB (W/ INDEX) W/ RFLX
Index Value: 0.12
JCV Antibody: NEGATIVE

## 2015-11-23 ENCOUNTER — Telehealth: Payer: Self-pay

## 2015-11-23 NOTE — Telephone Encounter (Signed)
Attempted to reach, no answer. VM full.

## 2015-11-23 NOTE — Telephone Encounter (Signed)
-----   Message from Richarda Overlie, [place] sent at 08/16/2015  8:36 AM EST ----- Needs Labs

## 2015-12-07 ENCOUNTER — Other Ambulatory Visit: Payer: Self-pay

## 2015-12-07 DIAGNOSIS — G35 Multiple sclerosis: Secondary | ICD-10-CM

## 2015-12-07 MED ORDER — HEPARIN SOD (PORK) LOCK FLUSH 100 UNIT/ML IV SOLN: 500.0000 [IU] | Freq: Once | INTRAVENOUS | Status: DC

## 2015-12-07 MED ORDER — NATALIZUMAB 300 MG/15ML IV CONC: 300.0000 mg | INTRAVENOUS | Status: AC

## 2015-12-12 ENCOUNTER — Encounter (HOSPITAL_COMMUNITY)
Admission: RE | Admit: 2015-12-12 | Discharge: 2015-12-12 | Disposition: A | Discharge status: Home / Self Care | Payer: BLUE CROSS/BLUE SHIELD | Source: Ambulatory Visit | Attending: Neurology | Admitting: Neurology

## 2015-12-12 ENCOUNTER — Ambulatory Visit (INDEPENDENT_AMBULATORY_CARE_PROVIDER_SITE_OTHER): Payer: BLUE CROSS/BLUE SHIELD | Admitting: Neurology

## 2015-12-12 ENCOUNTER — Encounter: Payer: Self-pay | Admitting: Neurology

## 2015-12-12 ENCOUNTER — Encounter (HOSPITAL_COMMUNITY): Payer: Self-pay

## 2015-12-12 VITALS — BP 104/60 | HR 65 | Ht 70.0 in | Wt 195.0 lb

## 2015-12-12 VITALS — BP 109/55 | HR 68 | Temp 98.2°F | Resp 16 | Wt 195.4 lb

## 2015-12-12 DIAGNOSIS — F329 Major depressive disorder, single episode, unspecified: Secondary | ICD-10-CM | POA: Diagnosis not present

## 2015-12-12 DIAGNOSIS — G35 Multiple sclerosis: Secondary | ICD-10-CM

## 2015-12-12 DIAGNOSIS — F32A Depression, unspecified: Secondary | ICD-10-CM

## 2015-12-12 MED ORDER — SODIUM CHLORIDE 0.9 % IV SOLN
INTRAVENOUS | Status: DC
  Administered 2015-12-12: 09:00:00 via INTRAVENOUS

## 2015-12-12 MED ORDER — SODIUM CHLORIDE 0.9 % IV SOLN
300.0000 mg | INTRAVENOUS | Status: DC
  Administered 2015-12-12: 300 mg via INTRAVENOUS
  Filled 2015-12-12: qty 15

## 2015-12-12 MED ORDER — HEPARIN LOCK FLUSH 100 UNIT/ML IV SOLN: 500.0000 [IU] | INTRAVENOUS | Status: DC | PRN

## 2015-12-12 MED ORDER — CITALOPRAM HYDROBROMIDE 20 MG PO TABS: 20.0000 mg | ORAL_TABLET | Freq: Every day | ORAL | Status: DC

## 2015-12-12 MED ORDER — HEPARIN SOD (PORK) LOCK FLUSH 100 UNIT/ML IV SOLN
500.0000 [IU] | Freq: Once | INTRAVENOUS | Status: AC
  Administered 2015-12-12: 500 [IU] via INTRAVENOUS
  Filled 2015-12-12: qty 5

## 2015-12-12 NOTE — Discharge Instructions (Signed)
IF YOU ARE GOING TO BE 15 OR MINUTES LATE FOR YOUR APPOINTMENT, PLEASE CALL [phone]  TO MAKE OTHER ARRANGEMENTS FOR YOUR TREATMENT  IF YOU ARRIVE EARLY FOR YOUR SCHEDULED APPOINTMENT , YOU MAY HAVE TO WAIT UNTIL YOUR SCHEDULED TIME.   Tysabri Natalizumab injection What is this medicine? NATALIZUMAB (na ta LIZ you mab) is used to treat relapsing multiple sclerosis. This drug is not a cure. It is also used to treat Crohn's disease. This medicine may be used for other purposes; ask your health care provider or pharmacist if you have questions. What should I tell my health care provider before I take this medicine? They need to know if you have any of these conditions: -immune system problems -progressive multifocal leukoencephalopathy (PML) -an unusual or allergic reaction to natalizumab, other medicines, foods, dyes, or preservatives -pregnant or trying to get pregnant -breast-feeding How should I use this medicine? This medicine is for infusion into a vein. It is given by a health care professional in a hospital or clinic setting. A special MedGuide will be given to you by the pharmacist with each prescription and refill. Be sure to read this information carefully each time. Talk to your pediatrician regarding the use of this medicine in children. This medicine is not approved for use in children. Overdosage: If you think you have taken too much of this medicine contact a poison control center or emergency room at once. NOTE: This medicine is only for you. Do not share this medicine with others. What if I miss a dose? It is important not to miss your dose. Call your doctor or health care professional if you are unable to keep an appointment. What may interact with this medicine? -azathioprine -cyclosporine -interferon -6-mercaptopurine -methotrexate -steroid medicines like prednisone or cortisone -TNF-alpha inhibitors like adalimumab, etanercept, and infliximab -vaccines This list  may not describe all possible interactions. Give your health care provider a list of all the medicines, herbs, non-prescription drugs, or dietary supplements you use. Also tell them if you smoke, drink alcohol, or use illegal drugs. Some items may interact with your medicine. What should I watch for while using this medicine? Your condition will be monitored carefully while you are receiving this medicine. Visit your doctor for regular check ups. Tell your doctor or healthcare professional if your symptoms do not start to get better or if they get worse. Stay away from people who are sick. Call your doctor or health care professional for advice if you get a fever, chills or sore throat, or other symptoms of a cold or flu. Do not treat yourself. In some patients, this medicine may cause a serious brain infection that may cause death. If you have any problems seeing, thinking, speaking, walking, or standing, tell your doctor right away. If you cannot reach your doctor, get urgent medical care. What side effects may I notice from receiving this medicine? Side effects that you should report to your doctor or health care professional as soon as possible: -allergic reactions like skin rash, itching or hives, swelling of the face, lips, or tongue -breathing problems -changes in vision -chest pain -dark urine -depression, feelings of sadness -dizziness -general ill feeling or flu-like symptoms -irregular, missed, or painful menstrual periods -light-colored stools -loss of appetite, nausea -muscle weakness -problems with balance, talking, or walking -right upper belly pain -unusually weak or tired -yellowing of the eyes or skin Side effects that usually do not require medical attention (report to your doctor or health care professional if  they continue or are bothersome): -aches, pains -headache -stomach upset -tiredness This list may not describe all possible side effects. Call your doctor for  medical advice about side effects. You may report side effects to FDA at 1-800-FDA-1088. Where should I keep my medicine? This drug is given in a hospital or clinic and will not be stored at home. NOTE: This sheet is a summary. It may not cover all possible information. If you have questions about this medicine, talk to your doctor, pharmacist, or health care provider.    2016, Elsevier/Gold Standard. (2008-10-16 13:33:21)

## 2015-12-12 NOTE — Patient Instructions (Addendum)
1.  For mood, we will start citalopram 20mg  daily.  Contact me in 1 month with update. 2.  Continue Tysabri 3.  Repeat CBC with diff, LFTs and JC Virus antibody with Index in 5 months. 4.  Repeat MRI of brain and cervical spine with and without contrast in December 5.  Follow up in 6 months after repeat labs.

## 2015-12-12 NOTE — Progress Notes (Signed)
NEUROLOGY FOLLOW UP OFFICE NOTE  Beth Santos [id number]  HISTORY OF PRESENT ILLNESS: Beth Santos is a 25 year old right-handed woman with no significant past medical history who follows up for relapsing-remitting MS.  Recent labs and imaging of MRI from December reviewed.  UPDATE: She receives a Tysabri infusion today, so she is feeling fatigued.  Over the past couple of months, she has been having fluctuating mood swings.  She is short-tempered and gets upset easily.  she reports work-related stress (she works at [employer]), but nothing new.  She has tried Lexapro, which is too expensive, and Zoloft, which was ineffective.   Otherwise, she is doing well. [facility] MRI of brain and cervical spine with and without contrat from 08/15/15 showed no acute demyelination or changes from prior imaging in June 2016. Labs from 11/15/15:  WBC 6.8, Hgb 11.6, HCT 34.5, PLT 205, ALC 3.4.  JC Virus antibody negative with index of 0.12.  HISTORY: On 07/27/14, she had left optic neuritis.  She had an MRI of the brain with and without contrast, which showed multiple hyperdense non-enhancing lesions in the supratentorial white matter with 5 mm enhancing lesion in the left occipital lobe.  There was associated asymmetric enhancement of the left optic nerve.  Labs showed ANA negative, Sed Rate 18, RPR non-reactive, TSH 6.060, free T4 1, HIV 1&2 antibodies non-reactive, and unremarkable CBC with diff.  She was treated with Solumedrol 1000mg  for 5 days.  Vision improved.  She has a history numbness in the left leg off and on for some time.  She also will sometimes experience tingling in the right foot.  She notes cold may exacerbate symptoms.  She reports feeling tired but no significant fatigue.  She occasionally notes twitching in her left temple.  She denies muscle cramps or gait instability.  She does not have family history of MS.  She started Tysabri in March 2016.  MRI of the cervical spine with and without contrast  performed on 10/01/14 showed subtle T2 hyperintense lesions at the level of C3-4 and C5, without enhancement. Repeat MRI of the brain and cervical spine with and without contrast performed on 03/03/15 showed new 12 mm non-enhancing lesion in left parietal lobe and possible new tiny nonenhancing lesion at the C7 level of spinal cord, however she had trouble receiving Tysabri because they could not get a line.  She now has a port.   PAST MEDICAL HISTORY: Past Medical History  Diagnosis Date  . Multiple sclerosis (HCC)   . History of seizure     age 67 - due to intentional overdose of multiple medications  . History of suicide attempt as a teenager  . History of dislocation     of jaw  . Dental crown present     right upper  . Poor venous access   . Depression   . Anxiety     MEDICATIONS: Current Outpatient Prescriptions on File Prior to Visit  Medication Sig Dispense Refill  . Ascorbic Acid (VITAMIN C) 1000 MG tablet Take 1,000 mg by mouth daily.    . diazepam (VALIUM) 10 MG tablet Take 10 mg by mouth every 6 (six) hours as needed for anxiety.    . lidocaine (XYLOCAINE) 5 % ointment Apply 1 application topically as needed. 35.44 g 0  . norgestimate-ethinyl estradiol (SPRINTEC 28) 0.25-35 MG-MCG tablet Take 1 tablet by mouth daily.    . phentermine 37.5 MG capsule Take 37.5 mg by mouth every morning. Reported on 12/12/2015  Current Facility-Administered Medications on File Prior to Visit  Medication Dose Route Frequency Provider Last Rate Last Dose  . heparin lock flush 100 unit/mL  500 Units Intravenous Once Drema Dallas, DO      . natalizumab (TYSABRI) injection 300 mg  300 mg Intravenous Q28 days Drema Dallas, DO        ALLERGIES: Allergies  Allergen Reactions  . Oxycodone-Acetaminophen Nausea And Vomiting  . Other     No antibiotics as it conflicts with therapy St[employer]- RASH    FAMILY HISTORY: Family History  Problem Relation Age of Onset  . Diabetes Father   .  Rheum arthritis Mother   . Cancer Maternal Grandmother     renal cell   . Diabetes Maternal Grandfather   . Hypertension Maternal Grandfather   . Alzheimer's disease Maternal Grandfather   . Cancer Paternal Grandmother     lung    SOCIAL HISTORY: Social History   Social History  . Marital Status: Married    Spouse Name: N/A  . Number of Children: N/A  . Years of Education: N/A   Occupational History  . Not on file.   Social History Main Topics  . Smoking status: Former Smoker -- 0.00 packs/day    Quit date: 07/10/2013  . Smokeless tobacco: Never Used  . Alcohol Use: 0.0 oz/week    0 Standard drinks or equivalent per week     Comment: occasionally  . Drug Use: No  . Sexual Activity:    Partners: Male    Birth Control/ Protection: None   Other Topics Concern  . Not on file   Social History Narrative    REVIEW OF SYSTEMS: Constitutional: No fevers, chills, or sweats, no generalized fatigue, change in appetite Eyes: No visual changes, double vision, eye pain Ear, nose and throat: No hearing loss, ear pain, nasal congestion, sore throat Cardiovascular: No chest pain, palpitations Respiratory:  No shortness of breath at rest or with exertion, wheezes GastrointestinaI: No nausea, vomiting, diarrhea, abdominal pain, fecal incontinence Genitourinary:  No dysuria, urinary retention or frequency Musculoskeletal:  No neck pain, back pain Integumentary: No rash, pruritus, skin lesions Neurological: as above Psychiatric: mood swings Endocrine: No palpitations, fatigue, diaphoresis, mood swings, change in appetite, change in weight, increased thirst Hematologic/Lymphatic:  No anemia, purpura, petechiae. Allergic/Immunologic: no itchy/runny eyes, nasal congestion, recent allergic reactions, rashes  PHYSICAL EXAM: Filed Vitals:   12/12/15 1509  BP: 104/60  Pulse: 65   General: No acute distress.  Patient appears well-groomed.  Head:  Normocephalic/atraumatic Eyes:   Fundoscopic exam unremarkable without vessel changes, exudates, hemorrhages or papilledema. Neck: supple, no paraspinal tenderness, full range of motion Heart:  Regular rate and rhythm Lungs:  Clear to auscultation bilaterally Back: No paraspinal tenderness Neurological Exam: alert and oriented to person, place, and time. Attention span and concentration intact, recent and remote memory intact, fund of knowledge intact.  Speech fluent and not dysarthric, language intact.  left V2 facial sensory loss, otherwise CN II-XII intact. Fundoscopic exam unremarkable without vessel changes, exudates, hemorrhages or papilledema.  Bulk and tone normal, muscle strength 5/5 throughout.  Decreased pinprick in left upper and lower extremities and reduced vibration in right foot.  Deep tendon reflexes 2+ throughout, toes downgoing.  Finger to nose and heel to shin testing intact.  Gait normal with Timed 25 Foot Walk of 4.83 seconds, able to  tandem walk, Romberg negative.  IMPRESSION: Relapsing-remitting MS depression  PLAN: 1.  Will try citalopram  daily  for mood/depression.  She will contact us in 4 weeks with update and we can increase dose to 40mg  if needed.  If not better by time of follow-up, strongly recommend seeing a therapist. 2.  Tysabri 3.  Check vitamin D level.  Recheck CBC with diff, LFTs and JC Virus Ab with Index in 5 months. 4.  Follow up in 6 months. 5.  Repeat MRI of brain and cervical spine with and without contrast in December  Shon Millet, DO

## 2015-12-13 ENCOUNTER — Telehealth: Payer: Self-pay

## 2015-12-13 DIAGNOSIS — Z79899 Other long term (current) drug therapy: Secondary | ICD-10-CM

## 2015-12-14 NOTE — Telephone Encounter (Signed)
Pt's vm is full. Cannot reach. Will attempt again later.

## 2015-12-15 NOTE — Telephone Encounter (Signed)
Attempted to reach pt again

## 2015-12-15 NOTE — Telephone Encounter (Signed)
VM Full. Cannot leave message

## 2016-01-09 ENCOUNTER — Encounter (HOSPITAL_COMMUNITY): Payer: Self-pay

## 2016-01-09 ENCOUNTER — Encounter (HOSPITAL_COMMUNITY)
Admission: RE | Admit: 2016-01-09 | Discharge: 2016-01-09 | Disposition: A | Discharge status: Home / Self Care | Payer: BLUE CROSS/BLUE SHIELD | Source: Ambulatory Visit | Attending: Neurology | Admitting: Neurology

## 2016-01-09 VITALS — BP 103/64 | HR 70 | Temp 98.1°F | Resp 16 | Ht 70.0 in | Wt 195.8 lb

## 2016-01-09 DIAGNOSIS — G35 Multiple sclerosis: Secondary | ICD-10-CM | POA: Insufficient documentation

## 2016-01-09 MED ORDER — HEPARIN SOD (PORK) LOCK FLUSH 100 UNIT/ML IV SOLN
500.0000 [IU] | Freq: Once | INTRAVENOUS | Status: AC
  Administered 2016-01-09: 500 [IU] via INTRAVENOUS
  Filled 2016-01-09: qty 5

## 2016-01-09 MED ORDER — SODIUM CHLORIDE 0.9 % IV SOLN
INTRAVENOUS | Status: DC
  Administered 2016-01-09: 09:00:00 via INTRAVENOUS

## 2016-01-09 MED ORDER — SODIUM CHLORIDE 0.9 % IV SOLN
300.0000 mg | INTRAVENOUS | Status: DC
  Administered 2016-01-09: 300 mg via INTRAVENOUS
  Filled 2016-01-09: qty 15

## 2016-02-13 ENCOUNTER — Encounter (HOSPITAL_COMMUNITY)
Admission: RE | Admit: 2016-02-13 | Discharge: 2016-02-13 | Disposition: A | Discharge status: Home / Self Care | Payer: BLUE CROSS/BLUE SHIELD | Source: Ambulatory Visit | Attending: Neurology | Admitting: Neurology

## 2016-02-13 ENCOUNTER — Encounter (HOSPITAL_COMMUNITY): Payer: Self-pay

## 2016-02-13 VITALS — BP 101/56 | HR 72 | Temp 97.8°F | Resp 20 | Wt 197.6 lb

## 2016-02-13 DIAGNOSIS — G35 Multiple sclerosis: Secondary | ICD-10-CM | POA: Insufficient documentation

## 2016-02-13 MED ORDER — SODIUM CHLORIDE 0.9 % IV SOLN
INTRAVENOUS | Status: DC
  Administered 2016-02-13: 08:00:00 via INTRAVENOUS

## 2016-02-13 MED ORDER — SODIUM CHLORIDE 0.9 % IV SOLN
300.0000 mg | INTRAVENOUS | Status: DC
  Administered 2016-02-13: 300 mg via INTRAVENOUS
  Filled 2016-02-13: qty 15

## 2016-02-13 MED ORDER — HEPARIN SOD (PORK) LOCK FLUSH 100 UNIT/ML IV SOLN
500.0000 [IU] | Freq: Once | INTRAVENOUS | Status: AC
  Administered 2016-02-13: 500 [IU] via INTRAVENOUS
  Filled 2016-02-13: qty 5

## 2016-02-24 IMAGING — MR MR CERVICAL SPINE WO/W CM
14 of 23 series · 21 of 48 positions shown · IV contrast (20    MULTIHANCE)
Comparison: Cervical spine MRI 10/01/2014 and brain MRI 07/28/2014

CLINICAL DATA: Relapsing remitting multiple sclerosis. Sudden onset
numbness involving the left arm and hand on 02/15/2015. Chronic left
leg numbness. Frequent left facial numbness.

EXAM:
MRI HEAD WITHOUT AND WITH CONTRAST
MRI CERVICAL SPINE WITHOUT AND WITH CONTRAST
TECHNIQUE: Multiplanar, multiecho pulse sequences of the brain and surrounding
structures, and cervical spine, to include the craniocervical
junction and cervicothoracic junction, were obtained without and
with intravenous contrast.
CONTRAST:  15mL MULTIHANCE GADOBENATE DIMEGLUMINE 529 MG/ML IV SOLN

[Series 3: FLAIR · sagittal · 5.0mm · 0.47mm/px · 1 of 23 slices shown (1 of 2)]
[im 1/23]
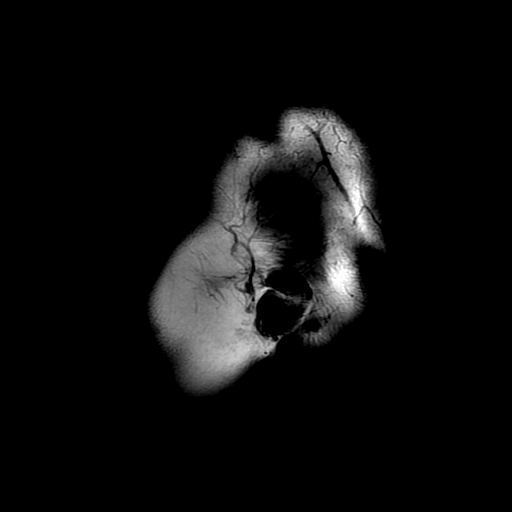

[Series 5: DWI · axial · 3.0mm · 0.94mm/px · z∈[-86,+61]mm · 4 of 102 slices shown]
[im 1/102]
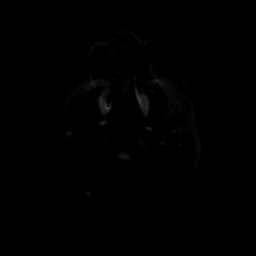
[im 34/102]
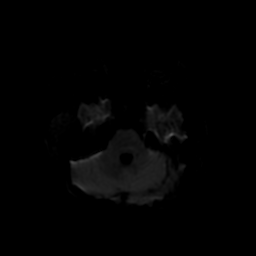
[im 68/102]
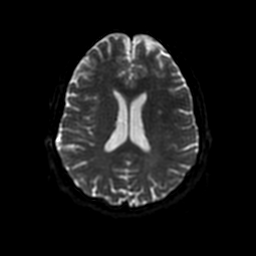
[im 102/102]
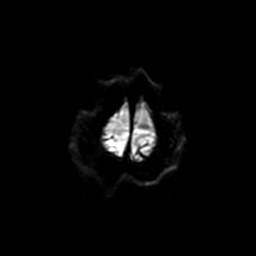

[Series 6: FLAIR · sagittal · 1.6mm · 0.49mm/px · 2 of 228 slices shown (2 of 2)]
[im 1/228]
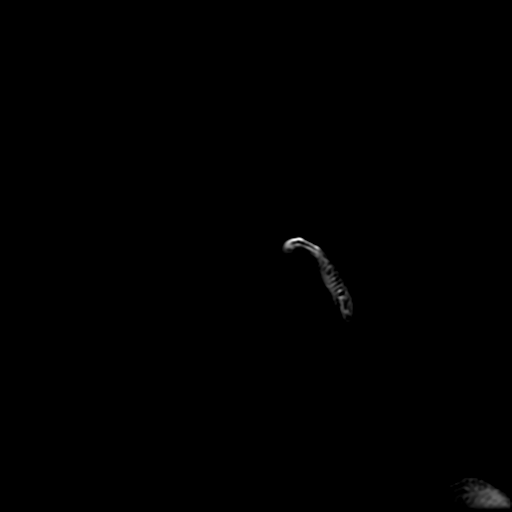
[im 26/228]
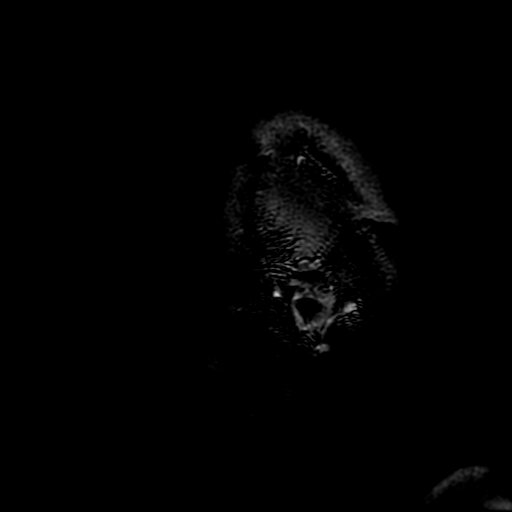

[Series 7: T2 · axial · 5.0mm · 0.47mm/px · 1 of 26 slices shown (1 of 4)]
[im 1/26]
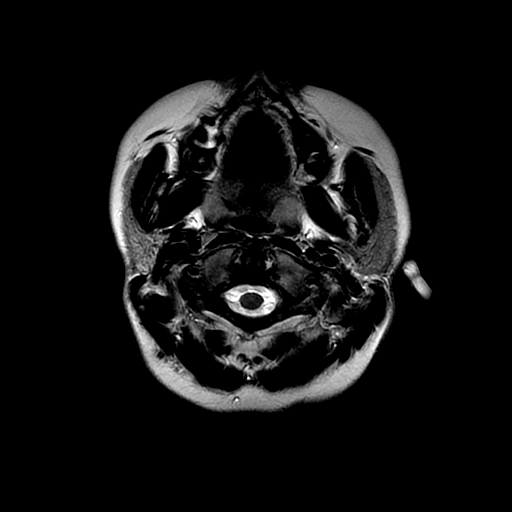

[Series 12: T2 · coronal · 5.0mm · 0.43mm/px · 1 of 28 slices shown (2 of 4)]
[im 1/28]
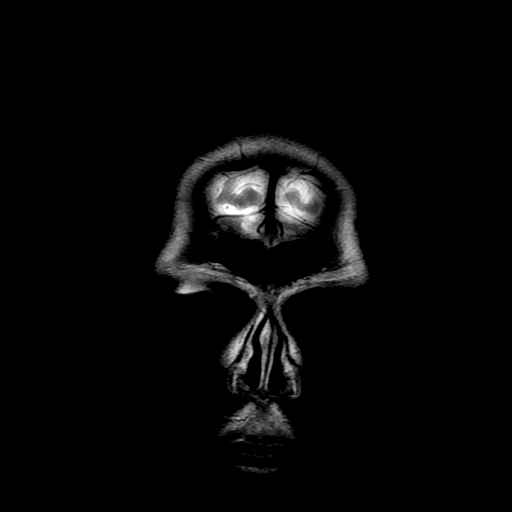

[Series 19: T1 · axial · 3.0mm · 0.35mm/px · z∈[-220,-104]mm · 2 of 40 slices shown (1 of 4)]
[im 1/40]
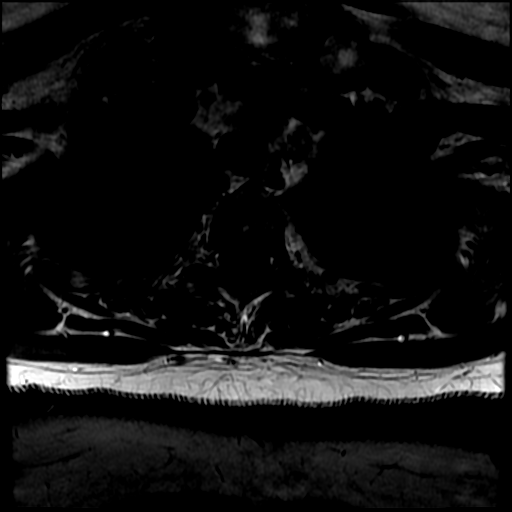
[im 40/40]
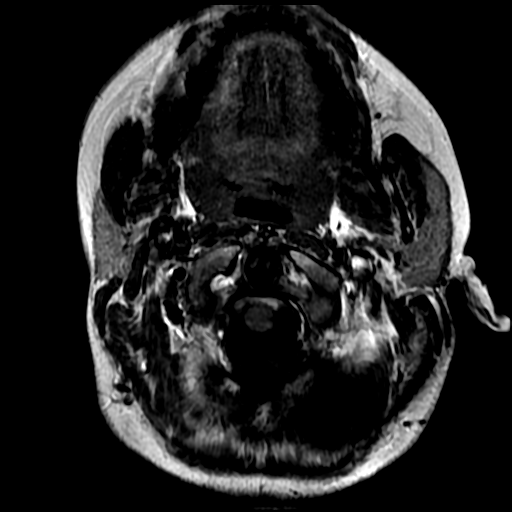

[Series 21: T1 fat-sat post-contrast · sagittal · 3.0mm · 0.43mm/px · 1 of 14 slices shown]
[im 1/14]
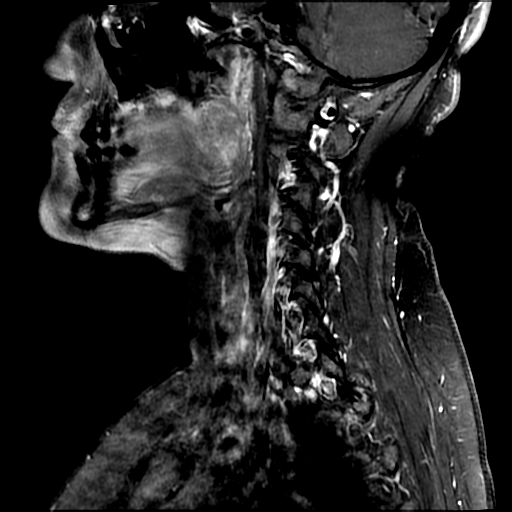

[Series 22: T1 post-contrast · axial · 3.0mm · 0.35mm/px · z∈[-222,-106]mm · 2 of 40 slices shown]
[im 1/40]
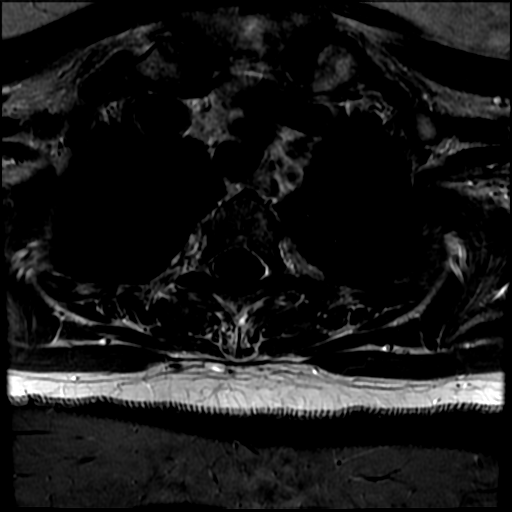
[im 40/40]
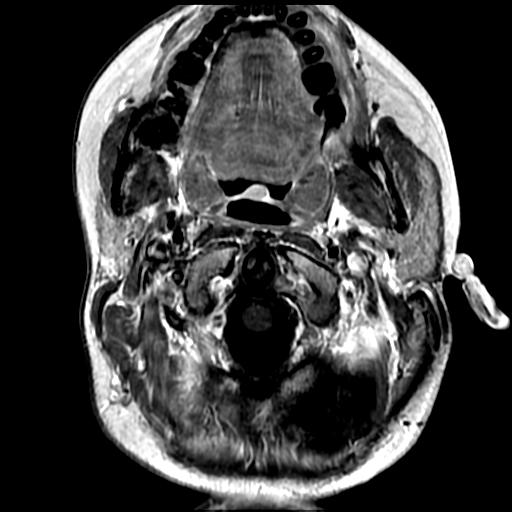

[Series 24: T1 · axial · 5.0mm · 0.47mm/px · 1 of 26 slices shown (2 of 4)]
[im 1/26]
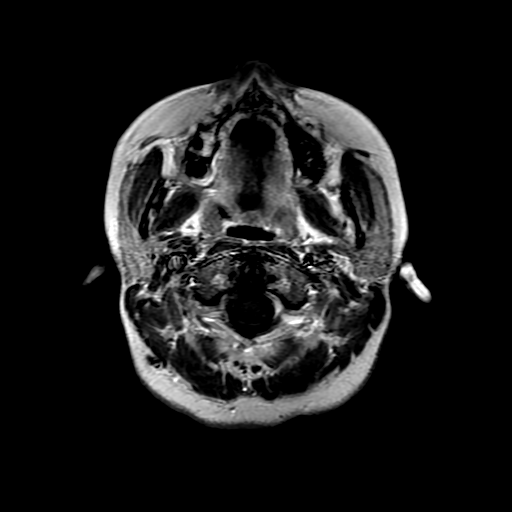

[Series 25: T1 · coronal · 5.0mm · 0.43mm/px · 1 of 28 slices shown (3 of 4)]
[im 1/28]
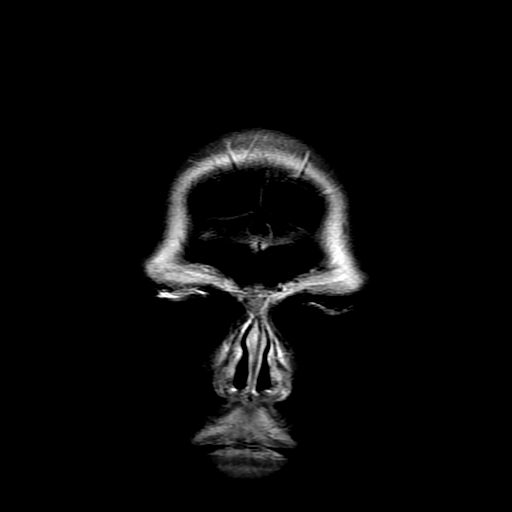

[Series 1400: T2 · sagittal · 3.0mm · 0.43mm/px · 1 of 14 slices shown (3 of 4)]
[im 1/14]
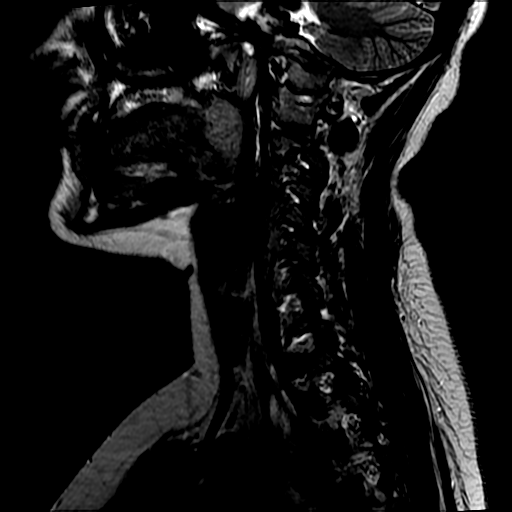

[Series 1600: STIR · sagittal · 3.0mm · 0.43mm/px · 1 of 14 slices shown]
[im 1/14]
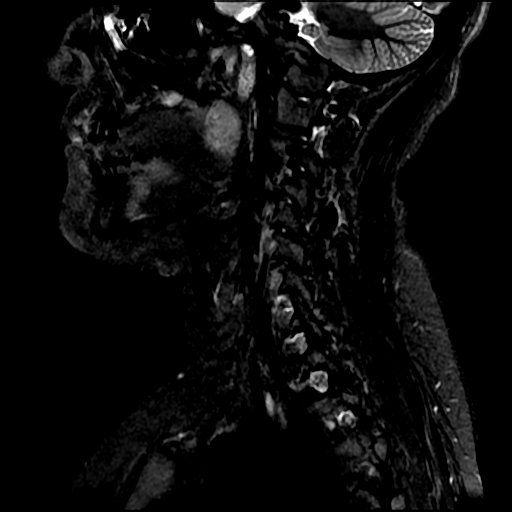

[Series 1800: T2 · axial · 3.0mm · 0.35mm/px · z∈[-220,-104]mm · 2 of 40 slices shown (4 of 4)]
[im 1/40]
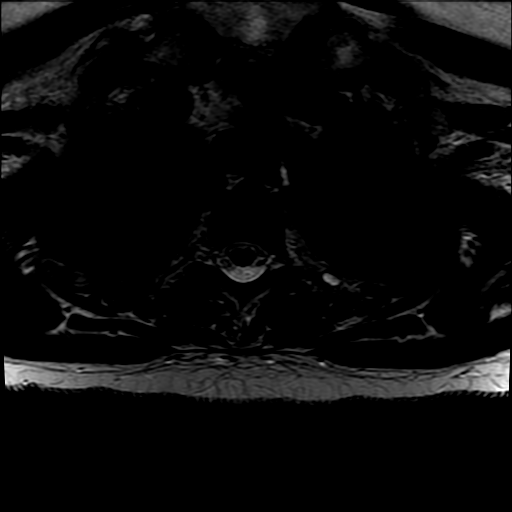
[im 40/40]
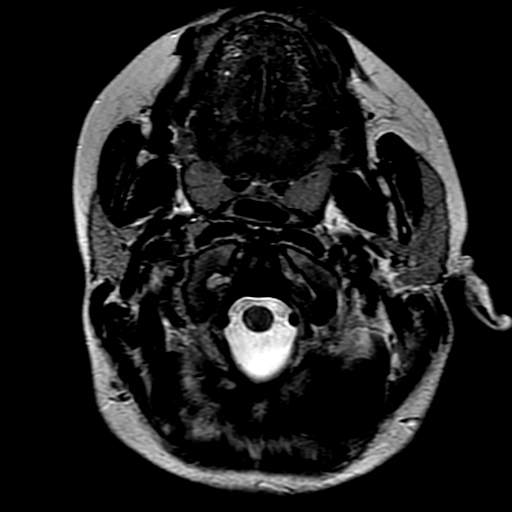

[Series 2000: T1 · sagittal · 3.0mm · 0.86mm/px · 1 of 14 slices shown (4 of 4)]
[im 1/14]
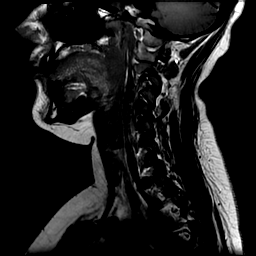

[21 of 48 positions shown; findings below may reference images not displayed]

FINDINGS: MRI HEAD FINDINGS

Incidental 6 mm pineal cyst is again noted. There is no evidence of
acute infarct, intracranial hemorrhage, mass, midline shift, or
extra-axial fluid collection. Ventricles and sulci are normal.

Multiple small T2 hyperintense lesions are again seen in the
juxtacortical, subcortical, and deep cerebral white matter
bilaterally. There is a new 12 mm nonenhancing left parietal white
matter lesion (series 8, image 17). The tiny enhancing left
occipital lesion on the prior study has resolved. The remaining
lesions do not appear significantly changed, and no enhancing
lesions are identified on the current examination. No definite
brainstem or cerebellar lesions are identified.

Orbits are unremarkable. Paranasal sinuses and mastoid air cells are
clear. Major intracranial vascular flow voids are preserved.

MRI CERVICAL SPINE FINDINGS

Vertebral alignment is normal. Vertebral body heights are preserved.
Cervical spinal cord is normal in caliber.

Small T2 hyperintense lesions in the left dorsolateral cord at C3-4
and right dorsolateral cord at C5-6 are stable to slightly less
conspicuous than on the prior study. A tiny, new T2 hyperintense
lesion is suspected in the left dorsolateral cord at C7. No definite
enhancing lesions are identified, with questionable enhancement on
sagittal T1 postcontrast images not confirmed on axial images and
favored to be artifact.

No disc herniation or stenosis is seen. Paraspinal soft tissues are
unremarkable.
IMPRESSION: 1. Multiple cerebral white matter lesions consistent with multiple
sclerosis. New 12 mm nonenhancing left parietal lesion. Interval
resolution of tiny enhancing left occipital lesion.
2. Possible new tiny nonenhancing C7 spinal cord lesion, with stable
to slightly decreased conspicuity of other cervical cord lesions.

## 2016-03-05 ENCOUNTER — Encounter (HOSPITAL_COMMUNITY): Payer: BLUE CROSS/BLUE SHIELD

## 2016-03-08 ENCOUNTER — Other Ambulatory Visit: Payer: Self-pay

## 2016-03-08 DIAGNOSIS — G35 Multiple sclerosis: Secondary | ICD-10-CM

## 2016-03-12 ENCOUNTER — Encounter (HOSPITAL_COMMUNITY): Payer: Self-pay

## 2016-03-12 ENCOUNTER — Encounter (HOSPITAL_COMMUNITY)
Admission: RE | Admit: 2016-03-12 | Discharge: 2016-03-12 | Disposition: A | Discharge status: Home / Self Care | Payer: BLUE CROSS/BLUE SHIELD | Source: Ambulatory Visit | Attending: Neurology | Admitting: Neurology

## 2016-03-12 VITALS — BP 102/48 | HR 59 | Temp 98.7°F | Resp 14 | Wt 197.8 lb

## 2016-03-12 DIAGNOSIS — G35 Multiple sclerosis: Secondary | ICD-10-CM | POA: Diagnosis present

## 2016-03-12 MED ORDER — SODIUM CHLORIDE 0.9% FLUSH
10.0000 mL | INTRAVENOUS | Status: DC
  Administered 2016-03-12: 10 mL via INTRAVENOUS

## 2016-03-12 MED ORDER — SODIUM CHLORIDE 0.9% FLUSH: 10.0000 mL | Freq: Two times a day (BID) | INTRAVENOUS | Status: DC

## 2016-03-12 MED ORDER — SODIUM CHLORIDE 0.9% FLUSH
10.0000 mL | INTRAVENOUS | Status: DC | PRN
  Administered 2016-03-12: 10 mL

## 2016-03-12 MED ORDER — HEPARIN SOD (PORK) LOCK FLUSH 100 UNIT/ML IV SOLN
500.0000 [IU] | Freq: Once | INTRAVENOUS | Status: AC
  Administered 2016-03-12: 500 [IU] via INTRAVENOUS
  Filled 2016-03-12: qty 5

## 2016-03-12 MED ORDER — NATALIZUMAB 300 MG/15ML IV CONC
300.0000 mg | INTRAVENOUS | Status: DC
  Administered 2016-03-12: 300 mg via INTRAVENOUS
  Filled 2016-03-12: qty 15

## 2016-03-12 MED ORDER — SODIUM CHLORIDE 0.9 % IV SOLN
INTRAVENOUS | Status: DC
  Administered 2016-03-12: 11:00:00 via INTRAVENOUS

## 2016-03-12 NOTE — Progress Notes (Signed)
  March 12, 2016  Patient: Beth Santos  Date of Birth: Jan 31, 1991  Date of Visit: 03/12/2016    To Whom It May Concern:  Madilyne Tadlock was seen and treated in our short stay care center on 03/12/2016. Rosebud Poles  And completed her treatment at 1 PM,  Sincerely,   Pauline Aus RN

## 2016-03-12 NOTE — Discharge Instructions (Signed)

## 2016-04-09 ENCOUNTER — Encounter (HOSPITAL_COMMUNITY): Payer: Self-pay

## 2016-04-09 ENCOUNTER — Encounter (HOSPITAL_COMMUNITY): Payer: BLUE CROSS/BLUE SHIELD

## 2016-04-09 ENCOUNTER — Encounter (HOSPITAL_COMMUNITY)
Admission: RE | Admit: 2016-04-09 | Discharge: 2016-04-09 | Disposition: A | Discharge status: Home / Self Care | Payer: BLUE CROSS/BLUE SHIELD | Source: Ambulatory Visit | Attending: Neurology | Admitting: Neurology

## 2016-04-09 DIAGNOSIS — G35 Multiple sclerosis: Secondary | ICD-10-CM

## 2016-04-09 MED ORDER — SODIUM CHLORIDE 0.9 % IV SOLN
300.0000 mg | INTRAVENOUS | Status: DC
  Administered 2016-04-09: 300 mg via INTRAVENOUS
  Filled 2016-04-09: qty 15

## 2016-04-09 MED ORDER — SODIUM CHLORIDE 0.9 % IV SOLN
INTRAVENOUS | Status: DC
  Administered 2016-04-09: 08:00:00 via INTRAVENOUS

## 2016-04-09 MED ORDER — SODIUM CHLORIDE 0.9% FLUSH
10.0000 mL | INTRAVENOUS | Status: DC
  Administered 2016-04-09 (×2): 10 mL via INTRAVENOUS

## 2016-04-09 MED ORDER — HEPARIN SOD (PORK) LOCK FLUSH 100 UNIT/ML IV SOLN
500.0000 [IU] | INTRAVENOUS | Status: DC
  Administered 2016-04-09: 500 [IU] via INTRAVENOUS
  Filled 2016-04-09: qty 5

## 2016-05-07 ENCOUNTER — Encounter (HOSPITAL_COMMUNITY): Payer: Self-pay

## 2016-05-07 ENCOUNTER — Encounter (HOSPITAL_COMMUNITY)
Admission: RE | Admit: 2016-05-07 | Discharge: 2016-05-07 | Disposition: A | Discharge status: Home / Self Care | Payer: BLUE CROSS/BLUE SHIELD | Source: Ambulatory Visit | Attending: Neurology | Admitting: Neurology

## 2016-05-07 DIAGNOSIS — G35 Multiple sclerosis: Secondary | ICD-10-CM

## 2016-05-07 MED ORDER — SODIUM CHLORIDE 0.9 % IV SOLN
INTRAVENOUS | Status: DC
  Administered 2016-05-07: 11:00:00 via INTRAVENOUS

## 2016-05-07 MED ORDER — SODIUM CHLORIDE 0.9 % IV SOLN
300.0000 mg | INTRAVENOUS | Status: DC
  Administered 2016-05-07: 300 mg via INTRAVENOUS
  Filled 2016-05-07: qty 15

## 2016-05-07 MED ORDER — SODIUM CHLORIDE 0.9% FLUSH: 10.0000 mL | Freq: Two times a day (BID) | INTRAVENOUS | Status: DC

## 2016-05-07 MED ORDER — SODIUM CHLORIDE 0.9% FLUSH: 10.0000 mL | INTRAVENOUS | Status: DC

## 2016-05-07 MED ORDER — HEPARIN SOD (PORK) LOCK FLUSH 100 UNIT/ML IV SOLN
500.0000 [IU] | INTRAVENOUS | Status: DC
  Administered 2016-05-07: 500 [IU] via INTRAVENOUS
  Filled 2016-05-07: qty 5

## 2016-05-07 MED ORDER — SODIUM CHLORIDE 0.9% FLUSH
10.0000 mL | INTRAVENOUS | Status: DC | PRN
  Administered 2016-05-07: 10 mL
  Filled 2016-05-07: qty 40

## 2016-05-07 NOTE — Discharge Instructions (Signed)
°  May 07, 2016  Patient: Beth PolesJessica Alaimo  Date of Birth: 05/04/1991  Date of Visit: 05/07/2016    To Whom It May Concern:  Beth Santos was seen and treated in our department on 05/07/2016.   Sincerely,  Jamelle RushingSusan Jetaun Colbath RN  [facility]

## 2016-05-15 ENCOUNTER — Telehealth: Payer: Self-pay

## 2016-05-15 DIAGNOSIS — G35 Multiple sclerosis: Secondary | ICD-10-CM

## 2016-05-15 NOTE — Telephone Encounter (Signed)
-----   Message from Richarda Overlie, [place] sent at 12/13/2015  7:23 AM EDT ----- Repeat CBC with diff, LFTs and JC Virus antibody with Index in 5 months.

## 2016-05-15 NOTE — Telephone Encounter (Signed)
Order's placed in chart. Will attempt to reach pt after lunch

## 2016-05-15 NOTE — Telephone Encounter (Signed)
Attempted to reach vm box full will attempt again later

## 2016-05-15 NOTE — Telephone Encounter (Signed)
Left message on machine for pt to return call to the office.  

## 2016-05-16 NOTE — Telephone Encounter (Signed)
Left message on machine for pt to return call to the office.  

## 2016-06-04 ENCOUNTER — Encounter (HOSPITAL_COMMUNITY): Payer: Self-pay

## 2016-06-04 ENCOUNTER — Encounter (HOSPITAL_COMMUNITY)
Admission: RE | Admit: 2016-06-04 | Discharge: 2016-06-04 | Disposition: A | Discharge status: Home / Self Care | Payer: BLUE CROSS/BLUE SHIELD | Source: Ambulatory Visit | Attending: Neurology | Admitting: Neurology

## 2016-06-04 DIAGNOSIS — G35 Multiple sclerosis: Secondary | ICD-10-CM | POA: Diagnosis not present

## 2016-06-04 MED ORDER — SODIUM CHLORIDE 0.9 % IV SOLN
300.0000 mg | INTRAVENOUS | Status: DC
  Administered 2016-06-04: 300 mg via INTRAVENOUS
  Filled 2016-06-04: qty 15

## 2016-06-04 MED ORDER — SODIUM CHLORIDE 0.9% FLUSH
10.0000 mL | INTRAVENOUS | Status: DC | PRN
  Administered 2016-06-04: 10 mL
  Filled 2016-06-04: qty 40

## 2016-06-04 MED ORDER — HEPARIN SOD (PORK) LOCK FLUSH 100 UNIT/ML IV SOLN
500.0000 [IU] | INTRAVENOUS | Status: DC
  Administered 2016-06-04: 500 [IU] via INTRAVENOUS
  Filled 2016-06-04: qty 5

## 2016-06-04 MED ORDER — SODIUM CHLORIDE 0.9 % IV SOLN
INTRAVENOUS | Status: AC
  Administered 2016-06-04: 08:00:00 via INTRAVENOUS

## 2016-06-04 NOTE — Progress Notes (Signed)
Uneventful Tysabri infusion.  Pt stayed for the 1 hour observation post infusion.  Pt was d/c ambulatory with friend.

## 2016-06-12 ENCOUNTER — Other Ambulatory Visit (INDEPENDENT_AMBULATORY_CARE_PROVIDER_SITE_OTHER): Payer: BLUE CROSS/BLUE SHIELD

## 2016-06-12 ENCOUNTER — Encounter: Payer: Self-pay | Admitting: Neurology

## 2016-06-12 ENCOUNTER — Other Ambulatory Visit: Payer: BLUE CROSS/BLUE SHIELD

## 2016-06-12 ENCOUNTER — Ambulatory Visit (INDEPENDENT_AMBULATORY_CARE_PROVIDER_SITE_OTHER): Payer: BLUE CROSS/BLUE SHIELD | Admitting: Neurology

## 2016-06-12 VITALS — BP 120/76 | HR 77 | Ht 70.0 in | Wt 195.0 lb

## 2016-06-12 DIAGNOSIS — D729 Disorder of white blood cells, unspecified: Secondary | ICD-10-CM

## 2016-06-12 DIAGNOSIS — Z79899 Other long term (current) drug therapy: Secondary | ICD-10-CM | POA: Diagnosis not present

## 2016-06-12 DIAGNOSIS — G35 Multiple sclerosis: Secondary | ICD-10-CM | POA: Diagnosis not present

## 2016-06-12 LAB — CBC WITH DIFFERENTIAL/PLATELET
Basophils Absolute: 0.1 K/uL (ref 0.0–0.1)
Basophils Relative: 0.6 % (ref 0.0–3.0)
Eosinophils Absolute: 0.2 K/uL (ref 0.0–0.7)
Eosinophils Relative: 2.5 % (ref 0.0–5.0)
HCT: 36.3 % (ref 36.0–46.0)
Hemoglobin: 12.4 g/dL (ref 12.0–15.0)
Lymphocytes Relative: 59.7 % — ABNORMAL HIGH (ref 12.0–46.0)
Lymphs Abs: 5.9 K/uL — ABNORMAL HIGH (ref 0.7–4.0)
MCHC: 34.2 g/dL (ref 30.0–36.0)
MCV: 84 fl (ref 78.0–100.0)
Monocytes Absolute: 0.7 K/uL (ref 0.1–1.0)
Monocytes Relative: 7.2 % (ref 3.0–12.0)
Neutro Abs: 3 K/uL (ref 1.4–7.7)
Neutrophils Relative %: 30 % — ABNORMAL LOW (ref 43.0–77.0)
Platelets: 243 K/uL (ref 150.0–400.0)
RBC: 4.33 Mil/uL (ref 3.87–5.11)
RDW: 14.2 % (ref 11.5–15.5)
WBC: 9.9 K/uL (ref 4.0–10.5)

## 2016-06-12 LAB — HEPATIC FUNCTION PANEL
ALT: 14 U/L (ref 0–35)
AST: 20 U/L (ref 0–37)
Albumin: 3.7 g/dL (ref 3.5–5.2)
Alkaline Phosphatase: 55 U/L (ref 39–117)
Bilirubin, Direct: 0 mg/dL (ref 0.0–0.3)
Total Bilirubin: 0.3 mg/dL (ref 0.2–1.2)
Total Protein: 6.6 g/dL (ref 6.0–8.3)

## 2016-06-12 LAB — VITAMIN D 25 HYDROXY (VIT D DEFICIENCY, FRACTURES): VITD: 15.68 ng/mL — ABNORMAL LOW (ref 30.00–100.00)

## 2016-06-12 NOTE — Patient Instructions (Signed)
1.  Continue Tysabri 2.  Check CBC with diff, LFTs, vitamin D and JC Virus antibody with index today and repeat again in 6 months. 3.  Check MRI of brain and cervical spine with and without contrast in December 4.  Follow up in 6 months.

## 2016-06-12 NOTE — Progress Notes (Signed)
NEUROLOGY FOLLOW UP OFFICE NOTE  Beth Santos [id number]  HISTORY OF PRESENT ILLNESS: Beth Santos is a 25 year old right-handed woman with no significant past medical history who follows up for relapsing-remitting MS.   She is accompanied by a friend who provides some history.    UPDATE: She is doing well.  She still has mood swings.  She did not tolerate citalopram.  She does not wish to see a psychiatrist for other potential options.  Numbness in the 5th digit of her right hand resolved. . Labs from 11/15/15:  WBC 6.8, Hgb 11.6, HCT 34.5, PLT 205, ALC 3.4.  JC Virus antibody negative with index of 0.12.   HISTORY: On 07/27/14, she had left optic neuritis.  She had an MRI of the brain with and without contrast, which showed multiple hyperdense non-enhancing lesions in the supratentorial white matter with 5 mm enhancing lesion in the left occipital lobe.  There was associated asymmetric enhancement of the left optic nerve.  Labs showed ANA negative, Sed Rate 18, RPR non-reactive, TSH 6.060, free T4 1, HIV 1&2 antibodies non-reactive, and unremarkable CBC with diff.  She was treated with Solumedrol 1000mg  for 5 days.  Vision improved.   She has a history numbness in the left leg off and on for some time.  She also will sometimes experience tingling in the right foot.  She notes cold may exacerbate symptoms.  She reports feeling tired but no significant fatigue.  She occasionally notes twitching in her left temple.  She denies muscle cramps or gait instability.  She does not have family history of MS.   She started Tysabri in March 2016.   MRI of the cervical spine with and without contrast performed on 10/01/14 showed subtle T2 hyperintense lesions at the level of C3-4 and C5, without enhancement. Repeat MRI of the brain and cervical spine with and without contrast performed on 03/03/15 showed new 12 mm non-enhancing lesion in left parietal lobe and possible new tiny nonenhancing lesion at the  C7 level of spinal cord, however she had trouble receiving Tysabri because they could not get a line.  She now has a port. MRI of brain and cervical spine with and without contradt from 08/15/15 showed no acute demyelination or changes from prior imaging in June 2016.  PAST MEDICAL HISTORY: Past Medical History:  Diagnosis Date  . Anxiety   . Dental crown present    right upper  . Depression   . History of dislocation    of jaw  . History of seizure    age 45 - due to intentional overdose of multiple medications  . History of suicide attempt as a teenager  . Multiple sclerosis (HCC)   . Poor venous access     MEDICATIONS: Current Outpatient Prescriptions on File Prior to Visit  Medication Sig Dispense Refill  . phentermine 37.5 MG capsule Take 37.5 mg by mouth every morning. Reported on 12/12/2015     Current Facility-Administered Medications on File Prior to Visit  Medication Dose Route Frequency Provider Last Rate Last Dose  . heparin lock flush 100 unit/mL  500 Units Intravenous Once Drema Dallas, DO        ALLERGIES: Allergies  Allergen Reactions  . Oxycodone-Acetaminophen Nausea And Vomiting  . Other     No antibiotics as it conflicts with therapy Stainless Steel- RASH    FAMILY HISTORY: Family History  Problem Relation Age of Onset  . Rheum arthritis Mother   . Diabetes  Father   . Cancer Maternal Grandmother     renal cell   . Diabetes Maternal Grandfather   . Hypertension Maternal Grandfather   . Alzheimer's disease Maternal Grandfather   . Cancer Paternal Grandmother     lung   .  SOCIAL HISTORY: Social History   Social History  . Marital status: Married    Spouse name: N/A  . Number of children: N/A  . Years of education: N/A   Occupational History  . Not on file.   Social History Main Topics  . Smoking status: Former Smoker    Packs/day: 0.00    Quit date: 07/10/2013  . Smokeless tobacco: Never Used  . Alcohol use 0.0 oz/week     Comment:  occasionally  . Drug use: No  . Sexual activity: Yes    Partners: Male    Birth control/ protection: None   Other Topics Concern  . Not on file   Social History Narrative  . No narrative on file    REVIEW OF SYSTEMS: Constitutional: No fevers, chills, or sweats, no generalized fatigue, change in appetite Eyes: No visual changes, double vision, eye pain Ear, nose and throat: No hearing loss, ear pain, nasal congestion, sore throat Cardiovascular: No chest pain, palpitations Respiratory:  No shortness of breath at rest or with exertion, wheezes GastrointestinaI: No nausea, vomiting, diarrhea, abdominal pain, fecal incontinence Genitourinary:  No dysuria, urinary retention or frequency Musculoskeletal:  No neck pain, back pain Integumentary: No rash, pruritus, skin lesions Neurological: as above Psychiatric: No depression, insomnia, anxiety Endocrine: No palpitations, fatigue, diaphoresis, mood swings, change in appetite, change in weight, increased thirst Hematologic/Lymphatic:  No purpura, petechiae. Allergic/Immunologic: no itchy/runny eyes, nasal congestion, recent allergic reactions, rashes  PHYSICAL EXAM: Vitals:   06/12/16 1133  BP: 120/76  Pulse: 77   General: No acute distress.  Patient appears well-groomed.  normal body habitus. Head:  Normocephalic/atraumatic Eyes:  Fundi examined but not visualized Neck: supple, no paraspinal tenderness, full range of motion Heart:  Regular rate and rhythm Lungs:  Clear to auscultation bilaterally Back: No paraspinal tenderness Neurological Exam: alert and oriented to person, place, and time. Attention span and concentration intact, recent and remote memory intact, fund of knowledge intact.  Speech fluent and not dysarthric, language intact.  left V2 facial sensory loss, otherwise CN II-XII intact. Bulk and tone normal, muscle strength 5/5 throughout.  Decreased pinprick in left upper and lower extremities and reduced vibration in  right foot.  Deep tendon reflexes 2+ throughout, toes downgoing.  Finger to nose and heel to shin testing intact.  Gait normal with Timed 25 Foot Walk of 4.83 seconds, able to  tandem walk, Romberg negative.  IMPRESSION: Relapsing remitting MS, stable  PLAN: Tysabri Check CBC with diff, LFTs, vitamin D and JC Virus ab with index today and then repeat labs in 6 months. MRI of brain and cervical spine with and without contrast in December Follow up in 6 months.  25 minutes spent face to face with patient, over 50% spent counseling.  Shon MilletAdam Yerlin Gasparyan, DO

## 2016-06-13 ENCOUNTER — Telehealth: Payer: Self-pay

## 2016-06-13 DIAGNOSIS — D729 Disorder of white blood cells, unspecified: Secondary | ICD-10-CM

## 2016-06-13 DIAGNOSIS — R7989 Other specified abnormal findings of blood chemistry: Secondary | ICD-10-CM

## 2016-06-13 LAB — PATHOLOGIST SMEAR REVIEW

## 2016-06-13 NOTE — Telephone Encounter (Signed)
-----   Message from Drema Dallas, DO sent at 06/13/2016  2:51 PM EDT ----- I tried contacting Ms. Bumpus regarding her CBC results, however her mailbox is full and I cannot leave a message.  She demonstrates elevated lympocytes, which is a type of white blood cell.  I'm not sure if Tysabri is a cause of this (I am looking into it) but I would like to refer her to hematology for further evaluation

## 2016-06-13 NOTE — Telephone Encounter (Signed)
Referral placed to [facility] Hematology &  Oncology.

## 2016-06-13 NOTE — Telephone Encounter (Signed)
Attempted to reach emergency contact to have pt return call. That number also did not have vm that was set up.

## 2016-06-14 ENCOUNTER — Other Ambulatory Visit: Payer: Self-pay

## 2016-06-14 ENCOUNTER — Telehealth: Payer: Self-pay

## 2016-06-14 DIAGNOSIS — G35 Multiple sclerosis: Secondary | ICD-10-CM

## 2016-06-14 NOTE — Telephone Encounter (Signed)
-----   Message from [facility] sent at 06/14/2016  9:53 AM EDT ----- Geraldine Contras from [facility] left a message to have a call back at [phone] regarding PT/Dawn

## 2016-06-14 NOTE — Telephone Encounter (Signed)
Attempted to reach again. VM full, cannot leave message.

## 2016-06-14 NOTE — Telephone Encounter (Signed)
-----   Message from Drema Dallas, DO sent at 06/14/2016  7:21 AM EDT ----- In addition:  Ms. Felder D level is low (around 15).  I would like her to start D3 4000 IU daily.

## 2016-06-15 NOTE — Telephone Encounter (Signed)
Attempted to reach pt.

## 2016-06-18 LAB — STRATIFY JCV AB (W/ INDEX) W/ RFLX
Index Value: 0.15
JCV Antibody: NEGATIVE

## 2016-06-19 NOTE — Telephone Encounter (Signed)
Attempted to reach no answer

## 2016-06-22 ENCOUNTER — Encounter: Payer: Self-pay | Admitting: Hematology

## 2016-06-22 ENCOUNTER — Telehealth: Payer: Self-pay | Admitting: Hematology

## 2016-06-22 NOTE — Telephone Encounter (Signed)
[facility] cld to make appt for the pt. Pt scheduled to see Kale on 11/14 @11am . Referring office says the pt is unaware of the referral and will call her w/appt date and time. I told them to have the pt to arrive 30 minutes early. Voiced understanding. Letter mailed to the patient.

## 2016-06-22 NOTE — Telephone Encounter (Signed)
Spoke with Hematology pt is scheduled for 11/14 @ 11 a.m. Arrival time of 10:30 a.m. Will be seeing Dr. Candise Che.

## 2016-06-22 NOTE — Telephone Encounter (Signed)
Left message for,  Loralie Champagne pt's mom, @ [phone]. Attempted to reach, Zamarah Quist pt's Husband @ [phone]. No VM box was set up.

## 2016-07-02 ENCOUNTER — Other Ambulatory Visit (HOSPITAL_COMMUNITY): Payer: Self-pay | Admitting: Neurology

## 2016-07-02 ENCOUNTER — Encounter (HOSPITAL_COMMUNITY): Admission: RE | Admit: 2016-07-02 | Payer: BLUE CROSS/BLUE SHIELD | Source: Ambulatory Visit

## 2016-07-06 NOTE — Telephone Encounter (Signed)
Attempted to reach pt again. VM box full. Unable to leave message.

## 2016-07-24 ENCOUNTER — Encounter: Payer: BLUE CROSS/BLUE SHIELD | Admitting: Hematology

## 2016-07-30 ENCOUNTER — Encounter (HOSPITAL_COMMUNITY): Payer: Self-pay

## 2016-07-30 ENCOUNTER — Encounter (HOSPITAL_COMMUNITY)
Admission: RE | Admit: 2016-07-30 | Discharge: 2016-07-30 | Disposition: A | Discharge status: Home / Self Care | Payer: BLUE CROSS/BLUE SHIELD | Source: Ambulatory Visit | Attending: Neurology | Admitting: Neurology

## 2016-07-30 DIAGNOSIS — G35 Multiple sclerosis: Secondary | ICD-10-CM | POA: Diagnosis not present

## 2016-07-30 MED ORDER — SODIUM CHLORIDE 0.9 % IV SOLN
300.0000 mg | INTRAVENOUS | Status: DC
  Administered 2016-07-30: 300 mg via INTRAVENOUS
  Filled 2016-07-30: qty 15

## 2016-07-30 MED ORDER — SODIUM CHLORIDE 0.9% FLUSH
10.0000 mL | INTRAVENOUS | Status: DC
  Administered 2016-07-30: 10 mL via INTRAVENOUS

## 2016-07-30 MED ORDER — SODIUM CHLORIDE 0.9 % IV SOLN
INTRAVENOUS | Status: DC
  Administered 2016-07-30: 11:00:00 via INTRAVENOUS

## 2016-07-30 MED ORDER — HEPARIN SOD (PORK) LOCK FLUSH 100 UNIT/ML IV SOLN
500.0000 [IU] | INTRAVENOUS | Status: DC
  Administered 2016-07-30: 500 [IU] via INTRAVENOUS
  Filled 2016-07-30: qty 5

## 2016-07-30 NOTE — Progress Notes (Signed)
RN is reviewing patient's medication prior to tysabri infusion. Patient becomes angry about RN asking about medication which has been discontinued. She swears at [employer]. RN informs patient there is no reason to speak in this manner.

## 2016-07-30 NOTE — Discharge Instructions (Signed)

## 2016-08-14 ENCOUNTER — Telehealth: Payer: Self-pay

## 2016-08-14 DIAGNOSIS — G35 Multiple sclerosis: Secondary | ICD-10-CM

## 2016-08-14 NOTE — Telephone Encounter (Addendum)
MRO brain w/ and w/o, cervical spine w/ and w/o

## 2016-08-14 NOTE — Telephone Encounter (Signed)
Letter written and given to Teddi to mail out certified.

## 2016-08-14 NOTE — Telephone Encounter (Signed)
I would like to send her a certified letter stating that we have been trying to contact her and if she does not respond within 30 days, we will not be able to continue prescribing her medications (including Tysabri).

## 2016-08-14 NOTE — Telephone Encounter (Signed)
Attempting to reach pt about repeat MRIs, unable to contact.  Pt has not be in contact in a few months. Have repeatedly called, mailed letters, and left messages with emergency contacts. She did not show for Tysabri in October, or Hematology appointment we made for her, she did show for her November Tysabri appointment on 07/30/16. Please advise.

## 2016-08-27 ENCOUNTER — Encounter (HOSPITAL_COMMUNITY): Payer: Self-pay

## 2016-08-27 ENCOUNTER — Encounter (HOSPITAL_COMMUNITY)
Admission: RE | Admit: 2016-08-27 | Discharge: 2016-08-27 | Disposition: A | Discharge status: Home / Self Care | Payer: BLUE CROSS/BLUE SHIELD | Source: Ambulatory Visit | Attending: Neurology | Admitting: Neurology

## 2016-08-27 DIAGNOSIS — G35 Multiple sclerosis: Secondary | ICD-10-CM

## 2016-08-27 MED ORDER — SODIUM CHLORIDE 0.9 % IV SOLN
300.0000 mg | INTRAVENOUS | Status: DC
  Administered 2016-08-27: 300 mg via INTRAVENOUS
  Filled 2016-08-27: qty 15

## 2016-08-27 MED ORDER — HEPARIN SOD (PORK) LOCK FLUSH 100 UNIT/ML IV SOLN
500.0000 [IU] | INTRAVENOUS | Status: DC
  Administered 2016-08-27: 500 [IU] via INTRAVENOUS
  Filled 2016-08-27: qty 5

## 2016-08-27 MED ORDER — SODIUM CHLORIDE 0.9% FLUSH
10.0000 mL | INTRAVENOUS | Status: DC
  Administered 2016-08-27: 10 mL via INTRAVENOUS

## 2016-08-27 MED ORDER — SODIUM CHLORIDE 0.9 % IV SOLN
INTRAVENOUS | Status: DC
  Administered 2016-08-27: 09:00:00 via INTRAVENOUS

## 2016-08-27 NOTE — Progress Notes (Signed)
Pt stayed 30 minutes of advised 1 hour post infusion observation time.  Denies problems.

## 2016-09-18 ENCOUNTER — Other Ambulatory Visit: Payer: Self-pay | Admitting: *Deleted

## 2016-09-18 ENCOUNTER — Telehealth: Payer: Self-pay | Admitting: Neurology

## 2016-09-18 DIAGNOSIS — G35 Multiple sclerosis: Secondary | ICD-10-CM

## 2016-09-18 NOTE — Telephone Encounter (Signed)
Spoke to [place]. Requesting Tysabri orders. Advised will have orders placed.

## 2016-09-18 NOTE — Telephone Encounter (Signed)
Dee with short stay called and is looking for some orders for PT/Dawn CB# [phone]

## 2016-09-19 ENCOUNTER — Other Ambulatory Visit: Payer: Self-pay | Admitting: *Deleted

## 2016-09-24 ENCOUNTER — Encounter (HOSPITAL_COMMUNITY): Payer: Self-pay

## 2016-09-24 ENCOUNTER — Encounter (HOSPITAL_COMMUNITY)
Admission: RE | Admit: 2016-09-24 | Discharge: 2016-09-24 | Disposition: A | Discharge status: Home / Self Care | Payer: BLUE CROSS/BLUE SHIELD | Source: Ambulatory Visit | Attending: Neurology | Admitting: Neurology

## 2016-09-24 DIAGNOSIS — G35 Multiple sclerosis: Secondary | ICD-10-CM | POA: Diagnosis not present

## 2016-09-24 MED ORDER — SODIUM CHLORIDE 0.9 % IV SOLN
300.0000 mg | INTRAVENOUS | Status: DC
  Administered 2016-09-24: 300 mg via INTRAVENOUS
  Filled 2016-09-24: qty 15

## 2016-09-24 MED ORDER — HEPARIN SOD (PORK) LOCK FLUSH 100 UNIT/ML IV SOLN
500.0000 [IU] | Freq: Once | INTRAVENOUS | Status: AC
  Administered 2016-09-24: 500 [IU] via INTRAVENOUS
  Filled 2016-09-24: qty 5

## 2016-09-24 MED ORDER — SODIUM CHLORIDE 0.9 % IV SOLN
INTRAVENOUS | Status: DC
  Administered 2016-09-24: 12:00:00 via INTRAVENOUS

## 2016-09-24 NOTE — Progress Notes (Signed)
Iv team came and attempted accessing port x2 per pt.  Unsuccessful.  Thomos Lemons, RN will attempt at this time.

## 2016-10-22 ENCOUNTER — Encounter (HOSPITAL_COMMUNITY)
Admission: RE | Admit: 2016-10-22 | Discharge: 2016-10-22 | Disposition: A | Discharge status: Home / Self Care | Payer: BLUE CROSS/BLUE SHIELD | Source: Ambulatory Visit | Attending: Neurology | Admitting: Neurology

## 2016-10-22 ENCOUNTER — Encounter (HOSPITAL_COMMUNITY): Payer: Self-pay

## 2016-10-22 DIAGNOSIS — G35 Multiple sclerosis: Secondary | ICD-10-CM | POA: Diagnosis not present

## 2016-10-22 MED ORDER — HEPARIN SOD (PORK) LOCK FLUSH 100 UNIT/ML IV SOLN
500.0000 [IU] | Freq: Once | INTRAVENOUS | Status: AC
  Administered 2016-10-22: 500 [IU] via INTRAVENOUS
  Filled 2016-10-22: qty 5

## 2016-10-22 MED ORDER — SODIUM CHLORIDE 0.9 % IV SOLN
INTRAVENOUS | Status: DC
  Administered 2016-10-22: 10:00:00 via INTRAVENOUS

## 2016-10-22 MED ORDER — SODIUM CHLORIDE 0.9% FLUSH
10.0000 mL | INTRAVENOUS | Status: DC | PRN
  Administered 2016-10-22: 10 mL via INTRAVENOUS
  Filled 2016-10-22: qty 10

## 2016-10-22 MED ORDER — SODIUM CHLORIDE 0.9 % IV SOLN
300.0000 mg | INTRAVENOUS | Status: DC
  Administered 2016-10-22: 300 mg via INTRAVENOUS
  Filled 2016-10-22: qty 15

## 2016-11-19 ENCOUNTER — Encounter (HOSPITAL_COMMUNITY): Admission: RE | Admit: 2016-11-19 | Payer: BLUE CROSS/BLUE SHIELD | Source: Ambulatory Visit

## 2016-12-10 ENCOUNTER — Other Ambulatory Visit: Payer: Self-pay

## 2016-12-10 DIAGNOSIS — G35 Multiple sclerosis: Secondary | ICD-10-CM

## 2016-12-11 ENCOUNTER — Ambulatory Visit (INDEPENDENT_AMBULATORY_CARE_PROVIDER_SITE_OTHER): Payer: BLUE CROSS/BLUE SHIELD | Admitting: Neurology

## 2016-12-11 ENCOUNTER — Encounter: Payer: Self-pay | Admitting: Neurology

## 2016-12-11 ENCOUNTER — Other Ambulatory Visit (INDEPENDENT_AMBULATORY_CARE_PROVIDER_SITE_OTHER): Payer: BLUE CROSS/BLUE SHIELD

## 2016-12-11 VITALS — BP 112/64 | HR 75 | Ht 70.0 in | Wt 195.2 lb

## 2016-12-11 DIAGNOSIS — G35 Multiple sclerosis: Secondary | ICD-10-CM

## 2016-12-11 DIAGNOSIS — D72820 Lymphocytosis (symptomatic): Secondary | ICD-10-CM | POA: Diagnosis not present

## 2016-12-11 LAB — HEPATIC FUNCTION PANEL
ALBUMIN: 3.7 g/dL (ref 3.5–5.2)
ALK PHOS: 55 U/L (ref 39–117)
ALT: 13 U/L (ref 0–35)
AST: 18 U/L (ref 0–37)
Bilirubin, Direct: 0 mg/dL (ref 0.0–0.3)
Total Bilirubin: 0.2 mg/dL (ref 0.2–1.2)
Total Protein: 6.5 g/dL (ref 6.0–8.3)

## 2016-12-11 LAB — CBC WITH DIFFERENTIAL/PLATELET
BASOS ABS: 0.1 10*3/uL (ref 0.0–0.1)
Basophils Relative: 0.7 % (ref 0.0–3.0)
EOS ABS: 0.2 10*3/uL (ref 0.0–0.7)
Eosinophils Relative: 3 % (ref 0.0–5.0)
HCT: 37.4 % (ref 36.0–46.0)
Hemoglobin: 12.7 g/dL (ref 12.0–15.0)
LYMPHS ABS: 3.9 10*3/uL (ref 0.7–4.0)
Lymphocytes Relative: 52.8 % — ABNORMAL HIGH (ref 12.0–46.0)
MCHC: 34 g/dL (ref 30.0–36.0)
MCV: 86 fl (ref 78.0–100.0)
MONOS PCT: 7.6 % (ref 3.0–12.0)
Monocytes Absolute: 0.6 10*3/uL (ref 0.1–1.0)
NEUTROS ABS: 2.7 10*3/uL (ref 1.4–7.7)
NEUTROS PCT: 35.9 % — AB (ref 43.0–77.0)
PLATELETS: 204 10*3/uL (ref 150.0–400.0)
RBC: 4.35 Mil/uL (ref 3.87–5.11)
RDW: 13.8 % (ref 11.5–15.5)
WBC: 7.5 10*3/uL (ref 4.0–10.5)

## 2016-12-11 NOTE — Patient Instructions (Signed)
1.  Continue Tysabri 2.  Check CBC with diff, hepatic panel, and JC Virus antibody with index today and again in 6 months (prior to follow up) 3.  MRI of brain and cervical spine with and without contrast 4.  We will refer you to hematology regarding elevated lymphocytes 5.  Start vitamin D3 4000 IU daily 6.  Follow up in 6 months.

## 2016-12-11 NOTE — Progress Notes (Signed)
NEUROLOGY FOLLOW UP OFFICE NOTE  Beth Santos [id number]  HISTORY OF PRESENT ILLNESS: Beth Santos is a 26 year old right-handed woman with no significant past medical history who follows up for relapsing-remitting MS.   She is accompanied by a friend who provides some history.    UPDATE: Over the course of a year, she has demonstrated elevated relative lymphocytes and absolute lymphocyte count. CBC from 06/12/16 demonstrated relative lymphocyte count of 59.7 and absolute lymphocyte count of 5.9.  WBC was 9.9.  Plan was to refer her to hematology, but she never returned our calls and letters.  She reports that she was recently diagnosed with bacterial vaginosis, which she may have had untreated up to a year, however she has demonstrated elevated lymphocytes for over a year.  Hepatic panel demonstrated total bil of 0.3, ALP 55, AST 20 and ALT 14.  Vitamin D level was 15.68.  She was advised to start D3 but she mistakenly thought I told her vitamin C.  Otherwise, she is doing well without any flares.  HISTORY: On 07/27/14, she had left optic neuritis.  She had an MRI of the brain with and without contrast, which showed multiple hyperdense non-enhancing lesions in the supratentorial white matter with 5 mm enhancing lesion in the left occipital lobe.  There was associated asymmetric enhancement of the left optic nerve.  Labs showed ANA negative, Sed Rate 18, RPR non-reactive, TSH 6.060, free T4 1, HIV 1&2 antibodies non-reactive, and unremarkable CBC with diff.  She was treated with Solumedrol 1000mg  for 5 days.  Vision improved.   She has a history numbness in the left leg off and on for some time.  She also will sometimes experience tingling in the right foot.  She notes cold may exacerbate symptoms.  She reports feeling tired but no significant fatigue.  She occasionally notes twitching in her left temple.  She denies muscle cramps or gait instability.  She does not have family history of MS.     She started Tysabri in March 2016.   MRI of the cervical spine with and without contrast performed on 10/01/14 showed subtle T2 hyperintense lesions at the level of C3-4 and C5, without enhancement. Repeat MRI of the brain and cervical spine with and without contrast performed on 03/03/15 showed new 12 mm non-enhancing lesion in left parietal lobe and possible new tiny nonenhancing lesion at the C7 level of spinal cord, however she had trouble receiving Tysabri because they could not get a line.  She now has a port. MRI of brain and cervical spine with and without contradt from 08/15/15 showed no acute demyelination or changes from prior imaging in June 2016.  PAST MEDICAL HISTORY: Past Medical History:  Diagnosis Date  . Anxiety   . Dental crown present    right upper  . Depression   . History of dislocation    of jaw  . History of seizure    age 42 - due to intentional overdose of multiple medications  . History of suicide attempt as a teenager  . Multiple sclerosis (HCC)   . Poor venous access     MEDICATIONS: Current Outpatient Prescriptions on File Prior to Visit  Medication Sig Dispense Refill  . natalizumab (TYSABRI) 300 MG/15ML injection Inject into the vein.    . norgestimate-ethinyl estradiol (ORTHO-CYCLEN,SPRINTEC,PREVIFEM) 0.25-35 MG-MCG tablet Take 1 tablet by mouth daily.     Current Facility-Administered Medications on File Prior to Visit  Medication Dose Route Frequency Provider Last Rate  Last Dose  . heparin lock flush 100 unit/mL  500 Units Intravenous Once Drema Dallas, DO        ALLERGIES: Allergies  Allergen Reactions  . Oxycodone-Acetaminophen Nausea And Vomiting  . Other     No antibiotics as it conflicts with therapy Stainless Steel- RASH    FAMILY HISTORY: Family History  Problem Relation Age of Onset  . Rheum arthritis Mother   . Diabetes Father   . Cancer Maternal Grandmother     renal cell   . Diabetes Maternal Grandfather   . Hypertension  Maternal Grandfather   . Alzheimer's disease Maternal Grandfather   . Cancer Paternal Grandmother     lung   SOCIAL HISTORY: Social History   Social History  . Marital status: Married    Spouse name: N/A  . Number of children: N/A  . Years of education: N/A   Occupational History  . Not on file.   Social History Main Topics  . Smoking status: Current Every Day Smoker    Packs/day: 1.00    Types: Cigarettes  . Smokeless tobacco: Never Used  . Alcohol use 0.0 oz/week     Comment: occasionally  . Drug use: No  . Sexual activity: Yes    Partners: Male    Birth control/ protection: None   Other Topics Concern  . Not on file   Social History Narrative  . No narrative on file   REVIEW OF SYSTEMS: Constitutional: No fevers, chills, or sweats, no generalized fatigue, change in appetite Eyes: No visual changes, double vision, eye pain Ear, nose and throat: No hearing loss, ear pain, nasal congestion, sore throat Cardiovascular: No chest pain, palpitations Respiratory:  No shortness of breath at rest or with exertion, wheezes GastrointestinaI: No nausea, vomiting, diarrhea, abdominal pain, fecal incontinence Genitourinary:  No dysuria, urinary retention or frequency Musculoskeletal:  No neck pain, back pain Integumentary: No rash, pruritus, skin lesions Neurological: as above Psychiatric: No depression, insomnia, anxiety Endocrine: No palpitations, fatigue, diaphoresis, mood swings, change in appetite, change in weight, increased thirst Hematologic/Lymphatic:  No purpura, petechiae. Allergic/Immunologic: no itchy/runny eyes, nasal congestion, recent allergic reactions, rashes  PHYSICAL EXAM: Vitals:   12/11/16 1123  BP: 112/64  Pulse: 75   General: No acute distress.  Patient appears  well-groomed.  Head:  Normocephalic/atraumatic Eyes:  Fundi examined but not visualized Neck: supple, no paraspinal tenderness, full range of motion Heart:  Regular rate and  rhythm Lungs:  Clear to auscultation bilaterally Back: No paraspinal tenderness Neurological Exam: alert and oriented to person, place, and time. Attention span and concentration intact, recent and remote memory intact, fund of knowledge intact.  Speech fluent and not dysarthric, language intact.  Trace left V2 facial sensory loss, otherwise CN II-XII intact. Bulk and tone normal, muscle strength 5/5 throughout.  Decreased pinprick in left upper and lower extremities and reduced vibration in right foot.  Deep tendon reflexes 2+ throughout, toes downgoing.  Finger to nose and heel to shin testing intact.  Gait normal with Timed 25 Foot Walk of 4.92 seconds, able to  tandem walk, Romberg negative.  IMPRESSION: 1.  Relapsing-remitting MS, stable 2.  Lymphocytosis.  There doesn't appear to be any cases associated with Tysabri use. 3.  Cigarette smoker  PLAN: 1.  Continue Tysabri 2.  Check CBC with diff, hepatic panel, and JC Virus antibody with index today and again in 6 months (prior to follow up) 3.  MRI of brain and cervical spine with and without  contrast 4.  We will refer Beth Santos to hematology regarding elevated lymphocytes 5.  Start vitamin D3 4000 IU daily 6.  Smoking cessation 7.  Follow up in 6 months.  26 minutes spent face to face with patient, over 50% spent discussing labs and management.  Shon Millet, DO

## 2016-12-16 LAB — STRATIFY JCV AB (W/ INDEX) W/ RFLX
Index Value: 0.11
JCV Antibody: NEGATIVE

## 2016-12-17 ENCOUNTER — Encounter (HOSPITAL_COMMUNITY): Payer: Self-pay

## 2016-12-17 ENCOUNTER — Encounter (HOSPITAL_COMMUNITY)
Admission: RE | Admit: 2016-12-17 | Discharge: 2016-12-17 | Disposition: A | Discharge status: Home / Self Care | Payer: BLUE CROSS/BLUE SHIELD | Source: Ambulatory Visit | Attending: Neurology | Admitting: Neurology

## 2016-12-17 DIAGNOSIS — G35 Multiple sclerosis: Secondary | ICD-10-CM

## 2016-12-17 MED ORDER — SODIUM CHLORIDE 0.9 % IV SOLN
300.0000 mg | INTRAVENOUS | Status: DC
  Administered 2016-12-17: 300 mg via INTRAVENOUS
  Filled 2016-12-17: qty 15

## 2016-12-17 MED ORDER — SODIUM CHLORIDE 0.9% FLUSH
10.0000 mL | INTRAVENOUS | Status: DC | PRN
  Administered 2016-12-17: 10 mL via INTRAVENOUS
  Filled 2016-12-17: qty 10

## 2016-12-17 MED ORDER — HEPARIN SOD (PORK) LOCK FLUSH 100 UNIT/ML IV SOLN
500.0000 [IU] | Freq: Once | INTRAVENOUS | Status: AC
  Administered 2016-12-17: 500 [IU] via INTRAVENOUS
  Filled 2016-12-17: qty 5

## 2016-12-17 MED ORDER — SODIUM CHLORIDE 0.9 % IV SOLN
INTRAVENOUS | Status: AC
  Administered 2016-12-17: 11:00:00 via INTRAVENOUS

## 2016-12-17 NOTE — Progress Notes (Signed)
Patient aware of one hour recommended observation time. She only stayed 15 min. IV was flushed with NS per order. Aware to call MD for any problems or questions.   Also, patient is having cervical biopsy on 4/24 which is 13 days prior to next Tysabri appt. Call placed to office and message left with nurse that will need ok per MD for her to receive Tysabri on next scheduled time (5/7) as will be within two weeks.

## 2017-01-05 ENCOUNTER — Encounter: Payer: Self-pay | Admitting: Oncology

## 2017-01-05 ENCOUNTER — Telehealth: Payer: Self-pay | Admitting: Oncology

## 2017-01-05 NOTE — Telephone Encounter (Signed)
Appt has been scheduled for the pt to see Dr. Clelia Croft on 5/29 at 11am. Pt aware to arrive 30 minutes early. Demographics verified. Pt agreed to the appt date and time.  Letter mailed to the pt.

## 2017-01-08 ENCOUNTER — Other Ambulatory Visit: Payer: Self-pay

## 2017-01-14 ENCOUNTER — Encounter (HOSPITAL_COMMUNITY): Admission: RE | Admit: 2017-01-14 | Payer: BLUE CROSS/BLUE SHIELD | Source: Ambulatory Visit

## 2017-01-25 ENCOUNTER — Telehealth: Payer: Self-pay | Admitting: Neurology

## 2017-01-25 NOTE — Telephone Encounter (Signed)
Talked with pat. She wanted to know who she was seeing at the cancer center.

## 2017-01-25 NOTE — Telephone Encounter (Signed)
Caller: PT  Urgent? No  Reason for the call: PT called and just left her name and DOB and phone number but no reason for the call

## 2017-01-25 NOTE — Telephone Encounter (Signed)
See message.

## 2017-02-05 ENCOUNTER — Ambulatory Visit (HOSPITAL_BASED_OUTPATIENT_CLINIC_OR_DEPARTMENT_OTHER): Payer: BLUE CROSS/BLUE SHIELD | Admitting: Oncology

## 2017-02-05 ENCOUNTER — Telehealth: Payer: Self-pay | Admitting: Oncology

## 2017-02-05 ENCOUNTER — Encounter: Payer: Self-pay | Admitting: *Deleted

## 2017-02-05 ENCOUNTER — Ambulatory Visit: Payer: BLUE CROSS/BLUE SHIELD

## 2017-02-05 VITALS — BP 102/50 | HR 81 | Temp 98.6°F | Resp 18 | Ht 70.0 in | Wt 194.2 lb

## 2017-02-05 DIAGNOSIS — D72820 Lymphocytosis (symptomatic): Secondary | ICD-10-CM

## 2017-02-05 DIAGNOSIS — Z801 Family history of malignant neoplasm of trachea, bronchus and lung: Secondary | ICD-10-CM

## 2017-02-05 DIAGNOSIS — Z720 Tobacco use: Secondary | ICD-10-CM | POA: Diagnosis not present

## 2017-02-05 DIAGNOSIS — Z808 Family history of malignant neoplasm of other organs or systems: Secondary | ICD-10-CM

## 2017-02-05 NOTE — Progress Notes (Signed)
Reason for Referral: Lymphocytosis.   HPI: 26 year old woman with a history of a mass that was diagnosed in 2015. She is currently receiving Natalizumab since 2016 after she presented with optic neuritis. She was noted to have a lymphocytosis in April 2018. Her lymphocyte percentage is 52.8%. Her total white cell count was normal at 7.5. She has normal hemoglobin and platelet count. Neutrophils slightly depressed at 35.9. These findings of lymphocytosis has been noted since 2015 and has been fluctuating. Her lymphocyte percentage was 28% in 2015. He was noted at 53% in June 2016. She is asymptomatic from these findings. She denied any lymphadenopathy, fevers, chills or constitutional symptoms. 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 She continues to attend activities of daily living.  She does not report any headaches, blurry vision, syncope or seizures. she does not report nausea, vomiting or abdominal pain. She does not report any constipation or diarrhea. She is not reporting chest pain, palpitation, orthopnea or leg edema. She does not report any cough, wheezing or hemoptysis. She is not reporting any frequency urgency or hesitancy. She does not report any skeletal complaints. She does not report any lymphadenopathy. Remainder review of systems unremarkable.   Past Medical History:  Diagnosis Date  . Anxiety   . Dental crown present    right upper  . Depression   . History of dislocation    of jaw  . History of seizure    age 26 - due to intentional overdose of multiple medications  . History of suicide attempt as a teenager  . Multiple sclerosis (HCC)   . Poor venous access   :  Past Surgical History:  Procedure Laterality Date  . APPENDECTOMY    . CHEST TUBE INSERTION Left 03/23/2015   Dr. Lindie Spruce  . Port-a-cath placement Left 03/22/2015   Dr. Corliss Skains  . PORTACATH PLACEMENT N/A 03/22/2015   Procedure: LEFT SUBCLAVIAN VEIN PORT PLACEMENT;  Surgeon:  Manus Rudd, MD;  Location: [facility];  Service: General;  Laterality: N/A;  :   Current Outpatient Prescriptions:  .  natalizumab (TYSABRI) 300 MG/15ML injection, Inject into the vein., Disp: , Rfl:  .  norgestimate-ethinyl estradiol (ORTHO-CYCLEN,SPRINTEC,PREVIFEM) 0.25-35 MG-MCG tablet, Take 1 tablet by mouth daily., Disp: , Rfl:  No current facility-administered medications for this visit.   Facility-Administered Medications Ordered in Other Visits:  .  heparin lock flush 100 unit/mL, 500 Units, Intravenous, Once, Jaffe, Adam R, DO:  Allergies  Allergen Reactions  . Oxycodone-Acetaminophen Nausea And Vomiting  . Other Rash    No antibiotics as it conflicts with therapy Stainless [employer]- RASH  :  Family History  Problem Relation Age of Onset  . Rheum arthritis Mother   . Diabetes Father   . Cancer Maternal Grandmother        renal cell   . Diabetes Maternal Grandfather   . Hypertension Maternal Grandfather   . Alzheimer's disease Maternal Grandfather   . Cancer Paternal Grandmother        lung  :  Social History   Social History  . Marital status: Married    Spouse name: N/A  . Number of children: N/A  . Years of education: N/A   Occupational History  . Not on file.   Social History Main Topics  . Smoking status: Current Every Day Smoker    Packs/day: 1.00    Types: Cigarettes  . Smokeless tobacco: Never Used  . Alcohol use 0.0 oz/week  Comment: occasionally  . Drug use: No  . Sexual activity: Yes    Partners: Male    Birth control/ protection: None   Other Topics Concern  . Not on file   Social History Narrative  . No narrative on file  :  Pertinent items are noted in HPI.  Exam: Blood pressure (!) 102/50, pulse 81, temperature 98.6 F (37 C), temperature source Oral, resp. rate 18, height 5\' 10"  (1.778 m), weight 194 lb 3.2 oz (88.1 kg), SpO2 100 %. General appearance: alert and cooperative appeared without  distress. Throat: No oral thrush or ulcers. Neck: no adenopathy Back: negative Resp: clear to auscultation bilaterally Cardio: regular rate and rhythm, S1, S2 normal, no murmur, click, rub or gallop GI: soft, non-tender; bowel sounds normal; no masses,  no organomegaly Extremities: extremities normal, atraumatic, no cyanosis or edema Skin: Skin color, texture, turgor normal. No rashes or lesions Lymph nodes: Cervical, supraclavicular, and axillary nodes normal.  CBC    Component Value Date/Time   WBC 7.5 12/11/2016 1156   RBC 4.35 12/11/2016 1156   HGB 12.7 12/11/2016 1156   HCT 37.4 12/11/2016 1156   PLT 204.0 12/11/2016 1156   MCV 86.0 12/11/2016 1156   MCH 28.0 05/30/2015 1053   MCHC 34.0 12/11/2016 1156   RDW 13.8 12/11/2016 1156   LYMPHSABS 3.9 12/11/2016 1156   MONOABS 0.6 12/11/2016 1156   EOSABS 0.2 12/11/2016 1156   BASOSABS 0.1 12/11/2016 1156     Assessment and Plan:   26 year old woman with the following issues:  1. Lymphocytosis noted in April 2018. She has a normal CBC otherwise with lymphocytosis and 52%. This percentage of fluctuated as high as 59% in October 2017 to slightly elevated in 2015. This is in the setting of multiple sclerosis and currently receiving monoclonal antibody treatment on a monthly basis.  The differential diagnosis was discussed today with the patient. These findings likely represent reactive lymphocytosis and mild neutropenia related due to either her condition or treatment. I do not think this is a sign of a lymphoproliferative disorder although this needs to be evaluated. Condition such as CLL, mantle cell lymphoma, and other lymphoproliferative disorders are considered a possibility but less likely.  The next step is to obtain a peripheral blood flow cytometry to evaluate for a monoclonal lymphocytosis. If nondetected, then we are dealing with a reactive lymphocytosis but does not require any treatment. If there is abnormality detected,  further investigation may be needed.  2. Follow-up: Will be determined by the results of her laboratory testing.If her flow cytometry is within normal range, no further hematology evaluation would be needed.

## 2017-02-05 NOTE — Telephone Encounter (Signed)
Labs added per 02/05/17 los. Patient was given a copy of the AVS report and appointment schedule, per 02/05/17 los. No follow up appointment to be scheduled at this time, per 02/05/17 los.

## 2017-02-05 NOTE — Telephone Encounter (Signed)
Patient was Beth Santos copy of the AVS report, per 02/05/17 los.

## 2017-02-11 ENCOUNTER — Encounter (HOSPITAL_COMMUNITY): Payer: Self-pay

## 2017-02-11 ENCOUNTER — Encounter (HOSPITAL_COMMUNITY)
Admission: RE | Admit: 2017-02-11 | Discharge: 2017-02-11 | Disposition: A | Discharge status: Home / Self Care | Payer: BLUE CROSS/BLUE SHIELD | Source: Ambulatory Visit | Attending: Neurology | Admitting: Neurology

## 2017-02-11 DIAGNOSIS — G35 Multiple sclerosis: Secondary | ICD-10-CM | POA: Insufficient documentation

## 2017-02-11 MED ORDER — SODIUM CHLORIDE 0.9 % IV SOLN
INTRAVENOUS | Status: DC
Start: 2017-02-11 — End: 2017-02-12
  Administered 2017-02-11: 09:00:00 via INTRAVENOUS

## 2017-02-11 MED ORDER — HEPARIN SOD (PORK) LOCK FLUSH 100 UNIT/ML IV SOLN
500.0000 [IU] | Freq: Once | INTRAVENOUS | Status: AC
  Administered 2017-02-11: 500 [IU] via INTRAVENOUS

## 2017-02-11 MED ORDER — HEPARIN SOD (PORK) LOCK FLUSH 100 UNIT/ML IV SOLN
100.0000 [IU] | Freq: Once | INTRAVENOUS | Status: DC
  Filled 2017-02-11: qty 5

## 2017-02-11 MED ORDER — SODIUM CHLORIDE 0.9 % IV SOLN
300.0000 mg | INTRAVENOUS | Status: DC
  Administered 2017-02-11: 300 mg via INTRAVENOUS
  Filled 2017-02-11: qty 15

## 2017-02-11 MED ORDER — SODIUM CHLORIDE 0.9 % IJ SOLN
10.0000 mL | INTRAMUSCULAR | Status: DC
  Administered 2017-02-11: 10 mL via INTRAVENOUS

## 2017-02-11 MED ORDER — HEPARIN SOD (PORK) LOCK FLUSH 100 UNIT/ML IV SOLN: 500.0000 [IU] | Freq: Once | INTRAVENOUS | Status: DC

## 2017-02-11 NOTE — Progress Notes (Signed)
Tolerated infusion.  Confirmed with Dr Everlena Cooper heparin flush dose of 500 u/ 5ml.  Pt refused any premeds.

## 2017-03-18 ENCOUNTER — Encounter (HOSPITAL_COMMUNITY): Payer: Self-pay

## 2017-03-18 ENCOUNTER — Encounter (HOSPITAL_COMMUNITY)
Admission: RE | Admit: 2017-03-18 | Discharge: 2017-03-18 | Disposition: A | Discharge status: Home / Self Care | Payer: BLUE CROSS/BLUE SHIELD | Source: Ambulatory Visit | Attending: Neurology | Admitting: Neurology

## 2017-03-18 DIAGNOSIS — G35 Multiple sclerosis: Secondary | ICD-10-CM | POA: Diagnosis not present

## 2017-03-18 MED ORDER — SODIUM CHLORIDE 0.9 % IV SOLN
300.0000 mg | INTRAVENOUS | Status: DC
  Administered 2017-03-18: 300 mg via INTRAVENOUS
  Filled 2017-03-18: qty 15

## 2017-03-18 MED ORDER — HEPARIN SOD (PORK) LOCK FLUSH 100 UNIT/ML IV SOLN
500.0000 [IU] | INTRAVENOUS | Status: AC | PRN
  Administered 2017-03-18: 500 [IU]

## 2017-03-18 MED ORDER — SODIUM CHLORIDE 0.9 % IV SOLN
INTRAVENOUS | Status: DC
  Administered 2017-03-18: 11:00:00 via INTRAVENOUS

## 2017-03-18 MED ORDER — HEPARIN SOD (PORK) LOCK FLUSH 100 UNIT/ML IV SOLN
500.0000 [IU] | Freq: Once | INTRAVENOUS | Status: AC
  Filled 2017-03-18: qty 5

## 2017-03-18 MED ORDER — SODIUM CHLORIDE 0.9 % IJ SOLN
10.0000 mL | INTRAMUSCULAR | Status: DC
  Administered 2017-03-18: 10 mL via INTRAVENOUS

## 2017-03-18 NOTE — Progress Notes (Signed)
Patient had uneventful Tysabri infusion today. Port accessed easily today. Patient declines to take any premeds (tylenol and claritin) as per MD order prior to Tysabri. Stayed 15 minutes post Tysabri infusion and was able to get 20cc NS flush completed. Declined to stay for entire 1 hour suggested observation time. Aware to call MD for problems or questions.

## 2017-03-18 NOTE — Progress Notes (Signed)
  March 18, 2017  Patient: Beth Santos  Date of Birth: January 05, 1991  Date of Visit: 03/18/2017    To Whom It May Concern:  Beth Santos was seen and treated in our  hosptial on 03/18/2017.   Sincerely,   Short Stay RN [facility]

## 2017-04-10 ENCOUNTER — Other Ambulatory Visit: Payer: Self-pay | Admitting: Neurology

## 2017-04-10 ENCOUNTER — Telehealth: Payer: Self-pay | Admitting: Neurology

## 2017-04-10 MED ORDER — SODIUM CHLORIDE 0.9 % IV SOLN: 300.0000 mg | INTRAVENOUS | Status: AC

## 2017-04-10 MED ORDER — HEPARIN SOD (PORK) LOCK FLUSH 100 UNIT/ML IV SOLN: 500.0000 [IU] | Freq: Once | INTRAVENOUS | Status: DC

## 2017-04-10 MED ORDER — SODIUM CHLORIDE 0.9% FLUSH: 10.0000 mL | INTRAVENOUS | Status: AC

## 2017-04-10 NOTE — Telephone Encounter (Signed)
Prior authorization submitted on patient. Orders entered.

## 2017-04-10 NOTE — Telephone Encounter (Signed)
-----   Message from Drema Dallas, DO sent at 04/10/2017  6:41 AM EDT ----- Regarding: RE: Continued-Request for New Heparin Flush Orders Contact: [phone] Hello, Ms. Rumery needs new Tysabri and Heparin Flush orders Thanks ----- Message ----- From: Katherene Ponto, NT Sent: 04/10/2017   5:32 AM To: Drema Dallas, DO, Azucena Kuba, RN Subject: Continued-Request for New Heparin Flush Orde#  Good morning Dr. Everlena Cooper again. I apologize. I failed to mention in my last message that I need new Heparin Flush orders for Ms. Swatzell's next infusion as well. Thanks again.  [employer]

## 2017-04-10 NOTE — Progress Notes (Signed)
Orders entered same as previous orders per Dr. Moises Blood instruction.

## 2017-04-10 NOTE — Telephone Encounter (Signed)
Called [employer] and spoke with [employer]. He states for codes Med- [id number] and CPT- 96365 that they do not require prior review. These are for Tysabri injections.

## 2017-04-11 NOTE — Addendum Note (Signed)
Addended by: Horatio Pel on: 04/11/2017 12:29 PM   Modules accepted: Orders

## 2017-04-15 ENCOUNTER — Encounter (HOSPITAL_COMMUNITY)
Admission: RE | Admit: 2017-04-15 | Discharge: 2017-04-15 | Disposition: A | Discharge status: Home / Self Care | Payer: BLUE CROSS/BLUE SHIELD | Source: Ambulatory Visit | Attending: Neurology | Admitting: Neurology

## 2017-04-15 ENCOUNTER — Encounter (HOSPITAL_COMMUNITY): Payer: Self-pay

## 2017-04-15 DIAGNOSIS — G35 Multiple sclerosis: Secondary | ICD-10-CM | POA: Diagnosis not present

## 2017-04-15 MED ORDER — LORATADINE 10 MG PO TABS: 10.0000 mg | ORAL_TABLET | Freq: Once | ORAL | Status: DC | PRN

## 2017-04-15 MED ORDER — SODIUM CHLORIDE 0.9 % IJ SOLN
10.0000 mL | INTRAMUSCULAR | Status: DC | PRN
  Administered 2017-04-15: 10 mL via INTRAVENOUS
  Filled 2017-04-15: qty 10

## 2017-04-15 MED ORDER — SODIUM CHLORIDE 0.9 % IV SOLN
300.0000 mg | Freq: Once | INTRAVENOUS | Status: AC
  Administered 2017-04-15: 300 mg via INTRAVENOUS
  Filled 2017-04-15: qty 15

## 2017-04-15 MED ORDER — ACETAMINOPHEN 500 MG PO TABS: 1000.0000 mg | ORAL_TABLET | Freq: Once | ORAL | Status: DC | PRN

## 2017-04-15 MED ORDER — HEPARIN SOD (PORK) LOCK FLUSH 100 UNIT/ML IV SOLN
500.0000 [IU] | Freq: Once | INTRAVENOUS | Status: AC
  Administered 2017-04-15: 500 [IU] via INTRAVENOUS
  Filled 2017-04-15: qty 5

## 2017-04-15 MED ORDER — SODIUM CHLORIDE 0.9 % IV SOLN
INTRAVENOUS | Status: DC
  Administered 2017-04-15: 10:00:00 via INTRAVENOUS

## 2017-05-09 NOTE — Progress Notes (Signed)
Faxed signed Tysabri reauthorization questionaire

## 2017-05-21 ENCOUNTER — Telehealth: Payer: Self-pay | Admitting: Neurology

## 2017-05-21 NOTE — Telephone Encounter (Signed)
Bea with short stay called and wanted a call back regarding pt CB# [phone]

## 2017-05-22 ENCOUNTER — Other Ambulatory Visit: Payer: Self-pay

## 2017-05-22 DIAGNOSIS — G35 Multiple sclerosis: Secondary | ICD-10-CM

## 2017-05-22 NOTE — Telephone Encounter (Signed)
Called short stay, they are rqsting orders for Tysabri, advsd will put in orders

## 2017-05-27 ENCOUNTER — Encounter (HOSPITAL_COMMUNITY)
Admission: RE | Admit: 2017-05-27 | Discharge: 2017-05-27 | Disposition: A | Discharge status: Home / Self Care | Payer: BLUE CROSS/BLUE SHIELD | Source: Ambulatory Visit | Attending: Neurology | Admitting: Neurology

## 2017-05-27 ENCOUNTER — Encounter (HOSPITAL_COMMUNITY): Payer: Self-pay

## 2017-05-27 DIAGNOSIS — G35 Multiple sclerosis: Secondary | ICD-10-CM | POA: Diagnosis not present

## 2017-05-27 MED ORDER — SODIUM CHLORIDE 0.9 % IV SOLN
INTRAVENOUS | Status: DC
  Administered 2017-05-27: 10:00:00 via INTRAVENOUS

## 2017-05-27 MED ORDER — NATALIZUMAB 300 MG/15ML IV CONC
300.0000 mg | INTRAVENOUS | Status: DC
  Administered 2017-05-27: 300 mg via INTRAVENOUS
  Filled 2017-05-27: qty 15

## 2017-05-27 MED ORDER — HEPARIN SOD (PORK) LOCK FLUSH 100 UNIT/ML IV SOLN
500.0000 [IU] | Freq: Once | INTRAVENOUS | Status: AC
  Administered 2017-05-27: 500 [IU] via INTRAVENOUS
  Filled 2017-05-27: qty 5

## 2017-05-27 NOTE — Discharge Instructions (Signed)
Natalizumab injection/Tysabri Infusion  °What is this medicine? °NATALIZUMAB (na ta LIZ you mab) is used to treat relapsing multiple sclerosis. This drug is not a cure. It is also used to treat Crohn's disease. °This medicine may be used for other purposes; ask your health care provider or pharmacist if you have questions. °COMMON BRAND NAME(S): Tysabri °What should I tell my health care provider before I take this medicine? °They need to know if you have any of these conditions: °-immune system problems °-progressive multifocal leukoencephalopathy (PML) °-an unusual or allergic reaction to natalizumab, other medicines, foods, dyes, or preservatives °-pregnant or trying to get pregnant °-breast-feeding °How should I use this medicine? °This medicine is for infusion into a vein. It is given by a health care professional in a hospital or clinic setting. °A special MedGuide will be given to you by the pharmacist with each prescription and refill. Be sure to read this information carefully each time. °Talk to your pediatrician regarding the use of this medicine in children. This medicine is not approved for use in children. °Overdosage: If you think you have taken too much of this medicine contact a poison control center or emergency room at once. °NOTE: This medicine is only for you. Do not share this medicine with others. °What if I miss a dose? °It is important not to miss your dose. Call your doctor or health care professional if you are unable to keep an appointment. °What may interact with this medicine? °-azathioprine °-cyclosporine °-interferon °-6-mercaptopurine °-methotrexate °-steroid medicines like prednisone or cortisone °-TNF-alpha inhibitors like adalimumab, etanercept, and infliximab °-vaccines °This list may not describe all possible interactions. Give your health care provider a list of all the medicines, herbs, non-prescription drugs, or dietary supplements you use. Also tell them if you smoke, drink  alcohol, or use illegal drugs. Some items may interact with your medicine. °What should I watch for while using this medicine? °Your condition will be monitored carefully while you are receiving this medicine. Visit your doctor for regular check ups. Tell your doctor or healthcare professional if your symptoms do not start to get better or if they get worse. °Stay away from people who are sick. Call your doctor or health care professional for advice if you get a fever, chills or sore throat, or other symptoms of a cold or flu. Do not treat yourself. °In some patients, this medicine may cause a serious brain infection that may cause death. If you have any problems seeing, thinking, speaking, walking, or standing, tell your doctor right away. If you cannot reach your doctor, get urgent medical care. °What side effects may I notice from receiving this medicine? °Side effects that you should report to your doctor or health care professional as soon as possible: °-allergic reactions like skin rash, itching or hives, swelling of the face, lips, or tongue °-breathing problems °-changes in vision °-chest pain °-dark urine °-depression, feelings of sadness °-dizziness °-general ill feeling or flu-like symptoms °-irregular, missed, or painful menstrual periods °-light-colored stools °-loss of appetite, nausea °-muscle weakness °-problems with balance, talking, or walking °-right upper belly pain °-unusually weak or tired °-yellowing of the eyes or skin °Side effects that usually do not require medical attention (report to your doctor or health care professional if they continue or are bothersome): °-aches, pains °-headache °-stomach upset °-tiredness °This list may not describe all possible side effects. Call your doctor for medical advice about side effects. You may report side effects to FDA at 1-800-FDA-1088. °Where should   I keep my medicine? °This drug is given in a hospital or clinic and will not be stored at  home. °NOTE: This sheet is a summary. It may not cover all possible information. If you have questions about this medicine, talk to your doctor, pharmacist, or health care provider. °© 2018 Elsevier/Gold Standard (2008-10-16 13:33:21) ° °

## 2017-05-27 NOTE — Progress Notes (Signed)
To whom it may concern:  Patient was treated at [facility] today.   Jamelle Rushing RN

## 2017-06-12 ENCOUNTER — Encounter: Payer: Self-pay | Admitting: Neurology

## 2017-06-12 ENCOUNTER — Ambulatory Visit: Payer: BLUE CROSS/BLUE SHIELD | Admitting: Neurology

## 2017-06-17 ENCOUNTER — Telehealth: Payer: Self-pay

## 2017-06-17 ENCOUNTER — Other Ambulatory Visit: Payer: Self-pay

## 2017-06-17 NOTE — Telephone Encounter (Signed)
Dee with [facility] Short stay called needing new orders put in. Please Advise. Thanks

## 2017-06-17 NOTE — Telephone Encounter (Signed)
I have attempted to reach this Pt on 3 days and her mailbox is full and can not accept any further messages,

## 2017-06-17 NOTE — Telephone Encounter (Signed)
-----   Message from Drema Dallas, DO sent at 06/12/2017 11:59 AM EDT ----- This [facility] patient no-showed today's appointment.  She is on Tysabri, so it is very important that she get routine labs drawn when requested.  She needs to get a CBC with diff, hepatic panel and test for JC virus antibody with index ASAP.  Otherwise, I will have to stop Tysabri.  She should reschedule a follow up

## 2017-06-18 NOTE — Telephone Encounter (Signed)
Spoke with [facility], I had already put in orders for heparin lock flush, she saw them

## 2017-06-24 ENCOUNTER — Encounter (HOSPITAL_COMMUNITY): Admission: RE | Admit: 2017-06-24 | Payer: BLUE CROSS/BLUE SHIELD | Source: Ambulatory Visit

## 2017-06-24 NOTE — Progress Notes (Signed)
Attempted to reach pt thru her husband Onalee Hua, no VM and her mother Ms Hermelinda Medicus, [place] on [address]

## 2017-07-22 ENCOUNTER — Encounter (HOSPITAL_COMMUNITY): Payer: Self-pay

## 2017-07-22 ENCOUNTER — Encounter (HOSPITAL_COMMUNITY)
Admission: RE | Admit: 2017-07-22 | Discharge: 2017-07-22 | Disposition: A | Discharge status: Home / Self Care | Payer: BLUE CROSS/BLUE SHIELD | Source: Ambulatory Visit | Attending: Neurology | Admitting: Neurology

## 2017-07-22 ENCOUNTER — Telehealth: Payer: Self-pay | Admitting: Neurology

## 2017-07-22 ENCOUNTER — Other Ambulatory Visit: Payer: BLUE CROSS/BLUE SHIELD

## 2017-07-22 ENCOUNTER — Other Ambulatory Visit: Payer: Self-pay

## 2017-07-22 DIAGNOSIS — G35 Multiple sclerosis: Secondary | ICD-10-CM

## 2017-07-22 MED ORDER — HEPARIN SOD (PORK) LOCK FLUSH 100 UNIT/ML IV SOLN: 500.0000 [IU] | Freq: Once | INTRAVENOUS | Status: DC

## 2017-07-22 MED ORDER — SODIUM CHLORIDE 0.9 % IV SOLN
300.0000 mg | INTRAVENOUS | Status: DC
  Filled 2017-07-22: qty 15

## 2017-07-22 MED ORDER — SODIUM CHLORIDE 0.9 % IV SOLN: INTRAVENOUS | Status: DC

## 2017-07-22 NOTE — Progress Notes (Signed)
Pt arrived, preinfusion assessment complete, medication approved for mixing, and port access kit opened when Dr Tomi Likens office called to say Pt's JC virus lab work was not done and Pt did not show up for last office appointment.  Was told to cancel today's appt/ orders.  Discussed with Pt, gave her next appointment  Time, and instructions to follow up with MD office and Short Stay scheduler.

## 2017-07-22 NOTE — Telephone Encounter (Signed)
Patient had Lab Work today but also wanted to see if she is to have ant more Infusions? Please Call. Thanks

## 2017-07-24 LAB — JC VIRUS DNA,PCR (WHOLE BLOOD): JC VIRUS DNA: NEGATIVE

## 2017-07-29 ENCOUNTER — Telehealth: Payer: Self-pay

## 2017-07-29 NOTE — Telephone Encounter (Signed)
Pts VM is full, have been unable to leave a message for 2 weeks

## 2017-07-29 NOTE — Telephone Encounter (Signed)
-----   Message from Adam R Jaffe, DO sent at 07/25/2017 11:22 AM EST ----- JC Virus negative.  She also needs CBC with Diff and hepatic panel and then we can reschedule for Tysabri.  She should also schedule follow up with me. 

## 2017-07-29 NOTE — Telephone Encounter (Signed)
Unable to reach Pt, VM full

## 2017-07-30 ENCOUNTER — Telehealth: Payer: Self-pay

## 2017-07-30 DIAGNOSIS — G35 Multiple sclerosis: Secondary | ICD-10-CM

## 2017-07-30 NOTE — Telephone Encounter (Signed)
Attempted to reach Pt again, mailbox is full, unable to leave message

## 2017-07-30 NOTE — Telephone Encounter (Signed)
-----   Message from Drema Dallas, DO sent at 07/25/2017 11:22 AM EST ----- JC Virus negative.  She also needs CBC with Diff and hepatic panel and then we can reschedule for Tysabri.  She should also schedule follow up with me.

## 2017-08-05 ENCOUNTER — Telehealth: Payer: Self-pay | Admitting: Neurology

## 2017-08-05 ENCOUNTER — Other Ambulatory Visit: Payer: BLUE CROSS/BLUE SHIELD

## 2017-08-05 DIAGNOSIS — G35 Multiple sclerosis: Secondary | ICD-10-CM

## 2017-08-05 LAB — HEPATIC FUNCTION PANEL
AG RATIO: 1.4 (calc) (ref 1.0–2.5)
ALKALINE PHOSPHATASE (APISO): 59 U/L (ref 33–115)
ALT: 9 U/L (ref 6–29)
AST: 13 U/L (ref 10–30)
Albumin: 3.7 g/dL (ref 3.6–5.1)
BILIRUBIN INDIRECT: 0.3 mg/dL (ref 0.2–1.2)
Bilirubin, Direct: 0 mg/dL (ref 0.0–0.2)
Globulin: 2.6 g/dL (calc) (ref 1.9–3.7)
TOTAL PROTEIN: 6.3 g/dL (ref 6.1–8.1)
Total Bilirubin: 0.3 mg/dL (ref 0.2–1.2)

## 2017-08-05 NOTE — Telephone Encounter (Signed)
Pt came up to be checked into the lab and said Dr Everlena Cooper wanted her to schedule a follow up but he has nothing until February, does he want to see her sooner than that

## 2017-08-05 NOTE — Telephone Encounter (Signed)
No, February should be fine. After we receive her lab results, we'll start her back on Tysabri infusions.

## 2017-08-06 LAB — CBC WITH DIFFERENTIAL
BASOS ABS: 0 10*3/uL (ref 0.0–0.2)
Basos: 1 %
EOS (ABSOLUTE): 0.2 10*3/uL (ref 0.0–0.4)
Eos: 3 %
Hematocrit: 38.8 % (ref 34.0–46.6)
Hemoglobin: 12.5 g/dL (ref 11.1–15.9)
Immature Grans (Abs): 0 10*3/uL (ref 0.0–0.1)
Immature Granulocytes: 0 %
LYMPHS ABS: 3.7 10*3/uL — AB (ref 0.7–3.1)
Lymphs: 55 %
MCH: 28.3 pg (ref 26.6–33.0)
MCHC: 32.2 g/dL (ref 31.5–35.7)
MCV: 88 fL (ref 79–97)
MONOS ABS: 0.6 10*3/uL (ref 0.1–0.9)
Monocytes: 9 %
NEUTROS PCT: 32 %
Neutrophils Absolute: 2.1 10*3/uL (ref 1.4–7.0)
RBC: 4.42 x10E6/uL (ref 3.77–5.28)
RDW: 13.9 % (ref 12.3–15.4)
WBC: 6.6 10*3/uL (ref 3.4–10.8)

## 2017-08-08 ENCOUNTER — Ambulatory Visit: Payer: BLUE CROSS/BLUE SHIELD | Admitting: Neurology

## 2017-08-13 ENCOUNTER — Other Ambulatory Visit: Payer: Self-pay

## 2017-08-13 NOTE — Progress Notes (Signed)
Dee from [facility] short stay (#[phone]) called requesting orders for Tysabri. I called back and spoke with Darel Hong. I only put in Tysabri and hep lock flush orders, Pt refuses tylenol and benadryl. Darel Hong agreed. I asked tha Geraldine Contras rtrn my call if anything additional needs to be put in.

## 2017-08-19 ENCOUNTER — Encounter (HOSPITAL_COMMUNITY): Payer: Self-pay

## 2017-08-19 ENCOUNTER — Encounter (HOSPITAL_COMMUNITY)
Admission: RE | Admit: 2017-08-19 | Discharge: 2017-08-19 | Disposition: A | Discharge status: Home / Self Care | Payer: BLUE CROSS/BLUE SHIELD | Source: Ambulatory Visit | Attending: Neurology | Admitting: Neurology

## 2017-08-19 DIAGNOSIS — G35 Multiple sclerosis: Secondary | ICD-10-CM | POA: Diagnosis not present

## 2017-08-19 MED ORDER — HEPARIN SOD (PORK) LOCK FLUSH 10 UNIT/ML IV SOLN
10.0000 [IU] | Freq: Once | INTRAVENOUS | Status: DC
  Filled 2017-08-19: qty 1

## 2017-08-19 MED ORDER — SODIUM CHLORIDE 0.9 % IV SOLN
300.0000 mg | Freq: Once | INTRAVENOUS | Status: AC
  Administered 2017-08-19: 300 mg via INTRAVENOUS
  Filled 2017-08-19: qty 15

## 2017-08-19 MED ORDER — SODIUM CHLORIDE 0.9 % IV SOLN
INTRAVENOUS | Status: DC
  Administered 2017-08-19: 09:00:00 via INTRAVENOUS

## 2017-08-19 MED ORDER — HEPARIN SOD (PORK) LOCK FLUSH 100 UNIT/ML IV SOLN
500.0000 [IU] | Freq: Once | INTRAVENOUS | Status: AC
  Administered 2017-08-19: 500 [IU] via INTRAVENOUS
  Filled 2017-08-19: qty 5

## 2017-09-20 ENCOUNTER — Other Ambulatory Visit: Payer: Self-pay

## 2017-09-27 ENCOUNTER — Encounter (HOSPITAL_COMMUNITY): Payer: Self-pay

## 2017-09-27 ENCOUNTER — Encounter (HOSPITAL_COMMUNITY)
Admission: RE | Admit: 2017-09-27 | Discharge: 2017-09-27 | Disposition: A | Discharge status: Home / Self Care | Payer: BLUE CROSS/BLUE SHIELD | Source: Ambulatory Visit | Attending: Neurology | Admitting: Neurology

## 2017-09-27 DIAGNOSIS — G35 Multiple sclerosis: Secondary | ICD-10-CM | POA: Insufficient documentation

## 2017-09-27 MED ORDER — HEPARIN SOD (PORK) LOCK FLUSH 100 UNIT/ML IV SOLN
500.0000 [IU] | Freq: Once | INTRAVENOUS | Status: AC
Start: 2017-09-27 — End: 2017-09-27
  Administered 2017-09-27: 500 [IU] via INTRAVENOUS
  Filled 2017-09-27: qty 5

## 2017-09-27 MED ORDER — SODIUM CHLORIDE 0.9 % IV SOLN
INTRAVENOUS | Status: DC
  Administered 2017-09-27: 13:00:00 via INTRAVENOUS

## 2017-09-27 MED ORDER — SODIUM CHLORIDE FLUSH 0.9 % IV SOLN
INTRAVENOUS | Status: AC
  Administered 2017-09-27: 10 mL
  Filled 2017-09-27: qty 10

## 2017-09-27 MED ORDER — NATALIZUMAB 300 MG/15ML IV CONC
300.0000 mg | INTRAVENOUS | Status: DC
  Administered 2017-09-27: 300 mg via INTRAVENOUS
  Filled 2017-09-27: qty 15

## 2017-09-27 NOTE — Progress Notes (Signed)
Patient did not stay the recommended one hour observation post Tysabri time. Aware to call MD with any concerns.

## 2017-09-27 NOTE — Discharge Instructions (Signed)
°Tysabri °Natalizumab injection °What is this medicine? °NATALIZUMAB (na ta LIZ you mab) is used to treat relapsing multiple sclerosis. This drug is not a cure. It is also used to treat Crohn's disease. °This medicine may be used for other purposes; ask your health care provider or pharmacist if you have questions. °COMMON BRAND NAME(S): Tysabri °What should I tell my health care provider before I take this medicine? °They need to know if you have any of these conditions: °-immune system problems °-progressive multifocal leukoencephalopathy (PML) °-an unusual or allergic reaction to natalizumab, other medicines, foods, dyes, or preservatives °-pregnant or trying to get pregnant °-breast-feeding °How should I use this medicine? °This medicine is for infusion into a vein. It is given by a health care professional in a hospital or clinic setting. °A special MedGuide will be given to you by the pharmacist with each prescription and refill. Be sure to read this information carefully each time. °Talk to your pediatrician regarding the use of this medicine in children. This medicine is not approved for use in children. °Overdosage: If you think you have taken too much of this medicine contact a poison control center or emergency room at once. °NOTE: This medicine is only for you. Do not share this medicine with others. °What if I miss a dose? °It is important not to miss your dose. Call your doctor or health care professional if you are unable to keep an appointment. °What may interact with this medicine? °-azathioprine °-cyclosporine °-interferon °-6-mercaptopurine °-methotrexate °-steroid medicines like prednisone or cortisone °-TNF-alpha inhibitors like adalimumab, etanercept, and infliximab °-vaccines °This list may not describe all possible interactions. Give your health care provider a list of all the medicines, herbs, non-prescription drugs, or dietary supplements you use. Also tell them if you smoke, drink  alcohol, or use illegal drugs. Some items may interact with your medicine. °What should I watch for while using this medicine? °Your condition will be monitored carefully while you are receiving this medicine. Visit your doctor for regular check ups. Tell your doctor or healthcare professional if your symptoms do not start to get better or if they get worse. °Stay away from people who are sick. Call your doctor or health care professional for advice if you get a fever, chills or sore throat, or other symptoms of a cold or flu. Do not treat yourself. °In some patients, this medicine may cause a serious brain infection that may cause death. If you have any problems seeing, thinking, speaking, walking, or standing, tell your doctor right away. If you cannot reach your doctor, get urgent medical care. °What side effects may I notice from receiving this medicine? °Side effects that you should report to your doctor or health care professional as soon as possible: °-allergic reactions like skin rash, itching or hives, swelling of the face, lips, or tongue °-breathing problems °-changes in vision °-chest pain °-dark urine °-depression, feelings of sadness °-dizziness °-general ill feeling or flu-like symptoms °-irregular, missed, or painful menstrual periods °-light-colored stools °-loss of appetite, nausea °-muscle weakness °-problems with balance, talking, or walking °-right upper belly pain °-unusually weak or tired °-yellowing of the eyes or skin °Side effects that usually do not require medical attention (report to your doctor or health care professional if they continue or are bothersome): °-aches, pains °-headache °-stomach upset °-tiredness °This list may not describe all possible side effects. Call your doctor for medical advice about side effects. You may report side effects to FDA at 1-800-FDA-1088. °Where should I   keep my medicine? °This drug is given in a hospital or clinic and will not be stored at  home. °NOTE: This sheet is a summary. It may not cover all possible information. If you have questions about this medicine, talk to your doctor, pharmacist, or health care provider. °© 2018 Elsevier/Gold Standard (2008-10-16 13:33:21) ° °

## 2017-10-24 ENCOUNTER — Encounter: Payer: Self-pay | Admitting: Neurology

## 2017-10-24 ENCOUNTER — Ambulatory Visit (INDEPENDENT_AMBULATORY_CARE_PROVIDER_SITE_OTHER): Payer: BLUE CROSS/BLUE SHIELD | Admitting: Neurology

## 2017-10-24 VITALS — BP 118/76 | HR 88 | Ht 70.0 in | Wt 193.2 lb

## 2017-10-24 DIAGNOSIS — F17200 Nicotine dependence, unspecified, uncomplicated: Secondary | ICD-10-CM | POA: Diagnosis not present

## 2017-10-24 DIAGNOSIS — G35 Multiple sclerosis: Secondary | ICD-10-CM

## 2017-10-24 NOTE — Progress Notes (Signed)
NEUROLOGY FOLLOW UP OFFICE NOTE  Beth Santos [id number]  HISTORY OF PRESENT ILLNESS: Beth Santos is a 27 year old right-handed woman with no significant past medical history who follows up for relapsing-remitting MS.   She is accompanied by a friend who provides some history.    UPDATE: She is on Tysabri.  Because she did not follow up and have her repeat labs drawn, her Tysabri in November was delayed.  She has since had her labs drawn.  JC Virus antibody from 07/22/17 was negative.  CBC from 08/05/17 demonstrated WBC 6.6, Hgb 12.5, HCT 38.8, ALC 3.7.  Hepatic panel with t bili 0.3, ALP 59, AST 13 and ALT 9.  She also did not have MRIs performed as requested.  Regarding lympocytosis, she was evaluated by heme/onc.  It was believed to be reactive, related to either the MS or treatment.  No follow up was recommended.  MRI of brain and cervical spine was ordered, which was not performed.  Vision:  For the past 3 days, she reports it is more difficult to focus with her left eye.  It is different from prior optic neuritis in that she didn't have white-out of vision.  She is scheduling appointment with her eye doctor. Motor:  No complaints Sensory:  No complaints Gait:  No complaints.   Bowel/bladder:  Sometimes she reports mild stress incontinence Fatigue:  Tired during the day Cognition:  No complaints Mood:  No complaints.  HISTORY: On 07/27/14, she had left optic neuritis.  She had an MRI of the brain with and without contrast, which showed multiple hyperdense non-enhancing lesions in the supratentorial white matter with 5 mm enhancing lesion in the left occipital lobe.  There was associated asymmetric enhancement of the left optic nerve.  Labs showed ANA negative, Sed Rate 18, RPR non-reactive, TSH 6.060, free T4 1, HIV 1&2 antibodies non-reactive, and unremarkable CBC with diff.  She was treated with Solumedrol 1000mg  for 5 days.  Vision improved.   She has a history numbness in the  left leg off and on for some time.  She also will sometimes experience tingling in the right foot.  She notes cold may exacerbate symptoms.  She reports feeling tired but no significant fatigue.  She occasionally notes twitching in her left temple.  She denies muscle cramps or gait instability.  She does not have family history of MS.   She started Tysabri in March 2016.   MRI of the cervical spine with and without contrast performed on 10/01/14 showed subtle T2 hyperintense lesions at the level of C3-4 and C5, without enhancement. Repeat MRI of the brain and cervical spine with and without contrast performed on 03/03/15 showed new 12 mm non-enhancing lesion in left parietal lobe and possible new tiny nonenhancing lesion at the C7 level of spinal cord, however she had trouble receiving Tysabri because they could not get a line.  She now has a port. MRI of brain and cervical spine with and without contradt from 08/15/15 showed no acute demyelination or changes from prior imaging in June 2016.  PAST MEDICAL HISTORY: Past Medical History:  Diagnosis Date  . Anxiety   . Dental crown present    right upper  . Depression   . History of dislocation    of jaw  . History of seizure    age 21 - due to intentional overdose of multiple medications  . History of suicide attempt as a teenager  . Multiple sclerosis (HCC)   .  Poor venous access     MEDICATIONS: Current Outpatient Medications on File Prior to Visit  Medication Sig Dispense Refill  . natalizumab (TYSABRI) 300 MG/15ML injection Inject into the vein.    . norgestimate-ethinyl estradiol (ORTHO-CYCLEN,SPRINTEC,PREVIFEM) 0.25-35 MG-MCG tablet Take 1 tablet by mouth daily.     Current Facility-Administered Medications on File Prior to Visit  Medication Dose Route Frequency Provider Last Rate Last Dose  . natalizumab (TYSABRI) 300 mg in sodium chloride 0.9 % 100 mL IVPB  300 mg Intravenous Q28 days Jaffe, Adam R, DO      . sodium chloride flush  (NS) 0.9 % injection 10 mL  10 mL Intravenous See admin instructions Shon Millet R, DO        ALLERGIES: Allergies  Allergen Reactions  . Oxycodone-Acetaminophen Nausea And Vomiting  . Other Rash    No antibiotics as it conflicts with therapy Stainless Steel- RASH    FAMILY HISTORY: Family History  Problem Relation Age of Onset  . Rheum arthritis Mother   . Diabetes Father   . Cancer Maternal Grandmother        renal cell   . Diabetes Maternal Grandfather   . Hypertension Maternal Grandfather   . Alzheimer's disease Maternal Grandfather   . Cancer Paternal Grandmother        lung    SOCIAL HISTORY: Social History   Socioeconomic History  . Marital status: Married    Spouse name: Not on file  . Number of children: Not on file  . Years of education: Not on file  . Highest education level: Not on file  Social Needs  . Financial resource strain: Not on file  . Food insecurity - worry: Not on file  . Food insecurity - inability: Not on file  . Transportation needs - medical: Not on file  . Transportation needs - non-medical: Not on file  Occupational History  . Not on file  Tobacco Use  . Smoking status: Current Every Day Smoker    Packs/day: 1.00    Types: Cigarettes  . Smokeless tobacco: Never Used  Substance and Sexual Activity  . Alcohol use: Yes    Alcohol/week: 0.0 oz    Comment: occasionally  . Drug use: No  . Sexual activity: Yes    Partners: Male    Birth control/protection: None  Other Topics Concern  . Not on file  Social History Narrative  . Not on file    REVIEW OF SYSTEMS: Constitutional: No fevers, chills, or sweats, no generalized fatigue, change in appetite Eyes: As above.  No double vision, eye pain Ear, nose and throat: No hearing loss, ear pain, nasal congestion, sore throat Cardiovascular: No chest pain, palpitations Respiratory:  No shortness of breath at rest or with exertion, wheezes GastrointestinaI: No nausea, vomiting,  diarrhea, abdominal pain, fecal incontinence Genitourinary:  No dysuria, urinary retention or frequency Musculoskeletal:  No neck pain, back pain Integumentary: No rash, pruritus, skin lesions Neurological: as above Psychiatric: No depression, insomnia, anxiety Endocrine: No palpitations, fatigue, diaphoresis, mood swings, change in appetite, change in weight, increased thirst Hematologic/Lymphatic:  No purpura, petechiae. Allergic/Immunologic: no itchy/runny eyes, nasal congestion, recent allergic reactions, rashes  PHYSICAL EXAM: Vitals:   10/24/17 0844  BP: 118/76  Pulse: 88  SpO2: 98%   General: No acute distress.  Patient appears well-groomed.   Head:  Normocephalic/atraumatic Eyes:  Fundi examined but not visualized Neck: supple, no paraspinal tenderness, full range of motion Heart:  Regular rate and rhythm Lungs:  Clear  to auscultation bilaterally Back: No paraspinal tenderness Neurological Exam: alert and oriented to person, place, and time. Attention span and concentration intact, recent and remote memory intact, fund of knowledge intact.  Speech fluent and not dysarthric, language intact.  Endorses mild reduced sensation in left V1 and right V3 distribution.  Otherwise, CN II-XII intact. Bulk and tone normal, muscle strength 5/5 throughout.  .  Decreased pinprick in left upper and lower extremities.  Deep tendon reflexes 2+ throughout, toes downgoing.  Finger to nose and heel to shin testing intact.  Timed 25 foot walk 5.18 seconds.  Gait normal, Romberg negative.  IMPRESSION: Relapsing remitting MS.  Overall, doing well.  Vague vision changes in left eye, different from prior optic neuritis.  PLAN: 1.  MRI of brain and cervical spine with and without contrast 2.  Continue Tysabri.  Advised to take vitamin D daily as recommended. 3.  Repeat CBC with diff, hepatic function panel, JC Virus antibody titer with index and vitamin D level in May 4.  Follow up with eye doctor. 5.   Smoking cessation 6.  Follow up in 6 months.  25 minutes spent face to face with patient, over 50% spent discussing management.  Shon Millet, DO

## 2017-10-24 NOTE — Patient Instructions (Addendum)
1.  Check MRI of brain and cervical spine with and without contrast  We have sent a referral to [facility] for your MRI and they will call you directly to schedule your appt. They are located at [address] [facility]. If you need to contact them directly please call [phone].   2.  Repeat CBC with diff, hepatic panel and vitamin D, and JC virus in 3 months.  Dr Everlena Cooper has requested that you have lab work done in 3 months (around Jan 21, 2018)  We share a lab with [facility] ([address] of [address]).  You do not need an appointment for lab work, however you will need to check in with our front desk, in [address], prior to having your labs drawn.    3.  Follow up with me in 6 months. 4.  Follow up with eye doctor

## 2017-10-25 ENCOUNTER — Encounter (HOSPITAL_COMMUNITY)
Admission: RE | Admit: 2017-10-25 | Discharge: 2017-10-25 | Disposition: A | Discharge status: Home / Self Care | Payer: BLUE CROSS/BLUE SHIELD | Source: Ambulatory Visit | Attending: Neurology | Admitting: Neurology

## 2017-10-25 DIAGNOSIS — G35 Multiple sclerosis: Secondary | ICD-10-CM | POA: Insufficient documentation

## 2017-10-25 MED ORDER — SODIUM CHLORIDE 0.9 % IV SOLN
INTRAVENOUS | Status: DC
  Administered 2017-10-25: 12:00:00 via INTRAVENOUS

## 2017-10-25 MED ORDER — SODIUM CHLORIDE 0.9 % IV SOLN
300.0000 mg | INTRAVENOUS | Status: DC
  Administered 2017-10-25: 300 mg via INTRAVENOUS
  Filled 2017-10-25: qty 15

## 2017-10-25 MED ORDER — HEPARIN SOD (PORK) LOCK FLUSH 100 UNIT/ML IV SOLN
500.0000 [IU] | INTRAVENOUS | Status: DC | PRN
  Administered 2017-10-25: 500 [IU]
  Filled 2017-10-25: qty 5

## 2017-10-31 ENCOUNTER — Telehealth: Payer: Self-pay | Admitting: Neurology

## 2017-10-31 NOTE — Telephone Encounter (Signed)
Called Pt, went to VM, VM is full, unable to leave message.

## 2017-10-31 NOTE — Telephone Encounter (Signed)
Pt called and has a question regarding her medication Tysabri and would like a call back

## 2017-11-15 ENCOUNTER — Other Ambulatory Visit: Payer: Self-pay

## 2017-11-15 ENCOUNTER — Other Ambulatory Visit (HOSPITAL_COMMUNITY): Payer: Self-pay | Admitting: General Practice

## 2017-11-15 ENCOUNTER — Telehealth: Payer: Self-pay

## 2017-11-15 NOTE — Telephone Encounter (Signed)
Eroneous encounter

## 2017-11-22 ENCOUNTER — Encounter (HOSPITAL_COMMUNITY): Payer: BLUE CROSS/BLUE SHIELD | Attending: Neurology

## 2017-11-22 ENCOUNTER — Ambulatory Visit (HOSPITAL_COMMUNITY)
Admission: RE | Admit: 2017-11-22 | Discharge: 2017-11-22 | Disposition: A | Discharge status: Home / Self Care | Payer: BLUE CROSS/BLUE SHIELD | Source: Ambulatory Visit | Attending: Neurology | Admitting: Neurology

## 2017-11-22 DIAGNOSIS — G35 Multiple sclerosis: Secondary | ICD-10-CM | POA: Insufficient documentation

## 2017-11-22 MED ORDER — GADOBENATE DIMEGLUMINE 529 MG/ML IV SOLN
20.0000 mL | Freq: Once | INTRAVENOUS | Status: AC | PRN
  Administered 2017-11-22: 19 mL via INTRAVENOUS

## 2017-11-22 MED ORDER — HEPARIN SOD (PORK) LOCK FLUSH 100 UNIT/ML IV SOLN
INTRAVENOUS | Status: AC
  Filled 2017-11-22: qty 5

## 2017-11-25 ENCOUNTER — Telehealth: Payer: Self-pay | Admitting: Neurology

## 2017-11-25 ENCOUNTER — Ambulatory Visit: Payer: BLUE CROSS/BLUE SHIELD | Admitting: Neurology

## 2017-11-25 ENCOUNTER — Telehealth: Payer: Self-pay

## 2017-11-25 DIAGNOSIS — G35 Multiple sclerosis: Secondary | ICD-10-CM

## 2017-11-25 NOTE — Telephone Encounter (Signed)
-----   Message from Drema Dallas, DO sent at 11/25/2017  6:45 AM EDT ----- MRI of cervical spine is stable.  However, the MRI of brain with and without contrast was not performed.  This needs to be performed.  Could we confirm that she has it scheduled and if not, to schedule it ASAP?  Thank you.

## 2017-11-25 NOTE — Telephone Encounter (Signed)
Labs and MRI of brain ordered. Patient will need to follow up with [facility] to schedule appt.

## 2017-11-25 NOTE — Telephone Encounter (Signed)
Pt stopped by office this morning.  Relayed MRI Cervical spine results at that time.  Advised pt that she needs to schedule MRI Brain with and without.  Phone number to [facility] Scheduling Department given to pt.

## 2017-11-25 NOTE — Telephone Encounter (Signed)
MRI ordered and patient notified.

## 2017-11-25 NOTE — Telephone Encounter (Signed)
Patient comes in to office to see when she will needs labs. Advise Stanton Kidney to let patient know she will needs labs drawn in May before appt. In August.

## 2017-11-25 NOTE — Telephone Encounter (Signed)
Patient left message on the voicemail about MRI and needs to talk to someone about it

## 2017-12-25 ENCOUNTER — Encounter (HOSPITAL_COMMUNITY): Payer: Self-pay

## 2017-12-25 ENCOUNTER — Encounter (HOSPITAL_COMMUNITY)
Admission: RE | Admit: 2017-12-25 | Discharge: 2017-12-25 | Disposition: A | Discharge status: Home / Self Care | Payer: BLUE CROSS/BLUE SHIELD | Source: Ambulatory Visit | Attending: Neurology | Admitting: Neurology

## 2017-12-25 DIAGNOSIS — G35 Multiple sclerosis: Secondary | ICD-10-CM | POA: Insufficient documentation

## 2017-12-25 MED ORDER — HEPARIN SOD (PORK) LOCK FLUSH 100 UNIT/ML IV SOLN: 500.0000 [IU] | INTRAVENOUS | Status: DC | PRN

## 2017-12-25 MED ORDER — SODIUM CHLORIDE 0.9 % IV SOLN
INTRAVENOUS | Status: DC
  Administered 2017-12-25: 10:00:00 via INTRAVENOUS

## 2017-12-25 MED ORDER — HEPARIN SOD (PORK) LOCK FLUSH 100 UNIT/ML IV SOLN
INTRAVENOUS | Status: AC
  Administered 2017-12-25: 500 [IU]
  Filled 2017-12-25: qty 5

## 2017-12-25 MED ORDER — SODIUM CHLORIDE 0.9 % IV SOLN: INTRAVENOUS | Status: DC

## 2017-12-25 MED ORDER — SODIUM CHLORIDE 0.9 % IV SOLN: 300.0000 mg | INTRAVENOUS | Status: DC

## 2017-12-25 MED ORDER — SODIUM CHLORIDE 0.9 % IV SOLN
300.0000 mg | INTRAVENOUS | Status: DC
  Administered 2017-12-25: 300 mg via INTRAVENOUS
  Filled 2017-12-25: qty 15

## 2018-01-02 ENCOUNTER — Encounter: Payer: Self-pay | Admitting: Neurology

## 2018-01-21 ENCOUNTER — Other Ambulatory Visit: Payer: BLUE CROSS/BLUE SHIELD

## 2018-01-21 DIAGNOSIS — G35 Multiple sclerosis: Secondary | ICD-10-CM

## 2018-01-22 ENCOUNTER — Encounter (HOSPITAL_COMMUNITY): Payer: BLUE CROSS/BLUE SHIELD

## 2018-01-22 ENCOUNTER — Other Ambulatory Visit: Payer: Self-pay

## 2018-01-23 LAB — CBC WITH DIFFERENTIAL/PLATELET
BASOS ABS: 81 {cells}/uL (ref 0–200)
Basophils Relative: 0.9 %
EOS ABS: 216 {cells}/uL (ref 15–500)
EOS PCT: 2.4 %
HCT: 37.5 % (ref 35.0–45.0)
HEMOGLOBIN: 12.8 g/dL (ref 11.7–15.5)
Lymphs Abs: 4797 cells/uL — ABNORMAL HIGH (ref 850–3900)
MCH: 29.2 pg (ref 27.0–33.0)
MCHC: 34.1 g/dL (ref 32.0–36.0)
MCV: 85.4 fL (ref 80.0–100.0)
MONOS PCT: 7.1 %
MPV: 11.9 fL (ref 7.5–12.5)
NEUTROS PCT: 36.3 %
Neutro Abs: 3267 cells/uL (ref 1500–7800)
PLATELETS: 231 10*3/uL (ref 140–400)
RBC: 4.39 10*6/uL (ref 3.80–5.10)
RDW: 12.9 % (ref 11.0–15.0)
TOTAL LYMPHOCYTE: 53.3 %
WBC mixed population: 639 cells/uL (ref 200–950)
WBC: 9 10*3/uL (ref 3.8–10.8)

## 2018-01-23 LAB — VITAMIN D 1,25 DIHYDROXY
VITAMIN D3 1, 25 (OH): 40 pg/mL
Vitamin D 1, 25 (OH)2 Total: 40 pg/mL (ref 18–72)
Vitamin D2 1, 25 (OH)2: <8 pg/mL

## 2018-01-23 LAB — HEPATIC FUNCTION PANEL
AG RATIO: 1.6 (calc) (ref 1.0–2.5)
ALKALINE PHOSPHATASE (APISO): 81 U/L (ref 33–115)
ALT: 19 U/L (ref 6–29)
AST: 20 U/L (ref 10–30)
Albumin: 4.3 g/dL (ref 3.6–5.1)
BILIRUBIN INDIRECT: 0.4 mg/dL (ref 0.2–1.2)
BILIRUBIN TOTAL: 0.5 mg/dL (ref 0.2–1.2)
Bilirubin, Direct: 0.1 mg/dL (ref 0.0–0.2)
Globulin: 2.7 g/dL (calc) (ref 1.9–3.7)
Total Protein: 7 g/dL (ref 6.1–8.1)

## 2018-01-25 LAB — TIQ-NTM

## 2018-01-25 LAB — STRATIFY JCV AB (W/ INDEX) W/ RFLX
Index Value: 0.09
STRATIFY JCV (TM) AB W/REFLEX INHIBITION: NEGATIVE

## 2018-01-29 ENCOUNTER — Telehealth: Payer: Self-pay

## 2018-01-29 NOTE — Telephone Encounter (Signed)
-----   Message from Drema Dallas, DO sent at 01/27/2018 12:47 PM EDT ----- Labs look okay.  The vitamin D level looks a little lower than I would like.  Please verify dose of D3 and add 2000 IU daily.  If she is not taking D3, then start 4000 IU daily.

## 2018-01-29 NOTE — Telephone Encounter (Signed)
Attempted to reach Pt, VM is full, unable to leave a message

## 2018-02-12 ENCOUNTER — Telehealth: Payer: Self-pay | Admitting: Neurology

## 2018-02-12 NOTE — Telephone Encounter (Signed)
Nicole (ncm) called needing to know the Plan Of Care for this patient. She left ext/ Ref # [id number]. Thanks

## 2018-02-14 NOTE — Telephone Encounter (Signed)
Called and spoke with Joni Reining RN with [facility] [phone] She has been unable to reach Pt. Went over next OV appt and infusion. Joni Reining asked I give Pt her number at next OV for any resources she may need

## 2018-02-19 ENCOUNTER — Encounter (HOSPITAL_COMMUNITY)
Admission: RE | Admit: 2018-02-19 | Discharge: 2018-02-19 | Disposition: A | Discharge status: Home / Self Care | Payer: BLUE CROSS/BLUE SHIELD | Source: Ambulatory Visit | Attending: Neurology | Admitting: Neurology

## 2018-02-19 ENCOUNTER — Encounter (HOSPITAL_COMMUNITY): Payer: Self-pay

## 2018-02-19 DIAGNOSIS — G35 Multiple sclerosis: Secondary | ICD-10-CM | POA: Insufficient documentation

## 2018-02-19 MED ORDER — SODIUM CHLORIDE 0.9 % IV SOLN
300.0000 mg | INTRAVENOUS | Status: DC
  Administered 2018-02-19: 300 mg via INTRAVENOUS
  Filled 2018-02-19: qty 15

## 2018-02-19 MED ORDER — HEPARIN SOD (PORK) LOCK FLUSH 100 UNIT/ML IV SOLN
500.0000 [IU] | Freq: Once | INTRAVENOUS | Status: AC
  Administered 2018-02-19: 500 [IU]
  Filled 2018-02-19: qty 5

## 2018-02-19 MED ORDER — SODIUM CHLORIDE 0.9 % IV SOLN
INTRAVENOUS | Status: DC
  Administered 2018-02-19: 13:00:00 via INTRAVENOUS

## 2018-02-19 NOTE — Progress Notes (Signed)
  February 19, 2018  Patient: Beth Santos  Date of Birth: 1991-06-24  Date of Visit: 02/19/2018    To Whom It May Concern:  Lavayah Grody was seen and treated in our short stay department on 02/19/2018. Zeda Waclawski  may return to work.  Sincerely,  Burnell Blanks , RN [facility]

## 2018-02-27 ENCOUNTER — Other Ambulatory Visit: Payer: Self-pay

## 2018-03-25 ENCOUNTER — Telehealth: Payer: Self-pay

## 2018-03-25 ENCOUNTER — Encounter (HOSPITAL_COMMUNITY)
Admission: RE | Admit: 2018-03-25 | Discharge: 2018-03-25 | Disposition: A | Discharge status: Home / Self Care | Payer: BLUE CROSS/BLUE SHIELD | Source: Ambulatory Visit | Attending: Neurology | Admitting: Neurology

## 2018-03-25 ENCOUNTER — Encounter (HOSPITAL_COMMUNITY): Payer: Self-pay

## 2018-03-25 DIAGNOSIS — G35 Multiple sclerosis: Secondary | ICD-10-CM | POA: Diagnosis present

## 2018-03-25 MED ORDER — SODIUM CHLORIDE 0.9 % IV SOLN
Freq: Once | INTRAVENOUS | Status: AC
  Administered 2018-03-25: 10:00:00 via INTRAVENOUS

## 2018-03-25 MED ORDER — HEPARIN SOD (PORK) LOCK FLUSH 100 UNIT/ML IV SOLN
500.0000 [IU] | INTRAVENOUS | Status: DC
  Administered 2018-03-25: 500 [IU] via INTRAVENOUS
  Filled 2018-03-25: qty 5

## 2018-03-25 MED ORDER — SODIUM CHLORIDE 0.9 % IV SOLN
300.0000 mg | INTRAVENOUS | Status: DC
  Administered 2018-03-25: 300 mg via INTRAVENOUS
  Filled 2018-03-25: qty 15

## 2018-03-25 NOTE — Progress Notes (Signed)
Patient arrived for Tysabri infusion today complaining of diffuclty consentrating and focus especially with reading.  Reported to Dr Everlena Cooper who approved infusion today.  Vital sign stable, patient remained for 1 hour post Tysabri infusion observation.  No change.

## 2018-03-25 NOTE — Telephone Encounter (Signed)
Received call from Kim at [facility]. Short stay.  Patient is there for her Tysabri infusion. She mentioned she is having trouble focusing and concentration. I advised Dr Everlena Cooper, he will discuss at next OV.

## 2018-04-02 ENCOUNTER — Emergency Department (HOSPITAL_COMMUNITY)
Admission: EM | Admit: 2018-04-02 | Discharge: 2018-04-03 | Disposition: A | Discharge status: Home / Self Care | Payer: BLUE CROSS/BLUE SHIELD | Attending: Emergency Medicine | Admitting: Emergency Medicine

## 2018-04-02 ENCOUNTER — Emergency Department (HOSPITAL_COMMUNITY): Payer: BLUE CROSS/BLUE SHIELD

## 2018-04-02 ENCOUNTER — Encounter (HOSPITAL_COMMUNITY): Payer: Self-pay | Admitting: Emergency Medicine

## 2018-04-02 DIAGNOSIS — G35 Multiple sclerosis: Secondary | ICD-10-CM | POA: Diagnosis not present

## 2018-04-02 DIAGNOSIS — R51 Headache: Secondary | ICD-10-CM | POA: Diagnosis present

## 2018-04-02 DIAGNOSIS — G43009 Migraine without aura, not intractable, without status migrainosus: Secondary | ICD-10-CM | POA: Insufficient documentation

## 2018-04-02 DIAGNOSIS — F17210 Nicotine dependence, cigarettes, uncomplicated: Secondary | ICD-10-CM | POA: Diagnosis not present

## 2018-04-02 DIAGNOSIS — Z79899 Other long term (current) drug therapy: Secondary | ICD-10-CM | POA: Diagnosis not present

## 2018-04-02 LAB — BASIC METABOLIC PANEL
ANION GAP: 8 (ref 5–15)
BUN: 10 mg/dL (ref 6–20)
CO2: 25 mmol/L (ref 22–32)
Calcium: 9.1 mg/dL (ref 8.9–10.3)
Chloride: 107 mmol/L (ref 98–111)
Creatinine, Ser: 0.88 mg/dL (ref 0.44–1.00)
GFR calc Af Amer: >60 mL/min (ref >60)
GLUCOSE: 99 mg/dL (ref 70–99)
Potassium: 4.6 mmol/L (ref 3.5–5.1)
Sodium: 140 mmol/L (ref 135–145)

## 2018-04-02 LAB — CBC WITH DIFFERENTIAL/PLATELET
ABS IMMATURE GRANULOCYTES: 0 10*3/uL (ref 0.0–0.1)
BASOS PCT: 1 %
Basophils Absolute: 0.1 10*3/uL (ref 0.0–0.1)
Eosinophils Absolute: 0.2 10*3/uL (ref 0.0–0.7)
Eosinophils Relative: 2 %
HCT: 37 % (ref 36.0–46.0)
Hemoglobin: 12.1 g/dL (ref 12.0–15.0)
IMMATURE GRANULOCYTES: 0 %
LYMPHS PCT: 49 %
Lymphs Abs: 4 10*3/uL (ref 0.7–4.0)
MCH: 28.7 pg (ref 26.0–34.0)
MCHC: 32.7 g/dL (ref 30.0–36.0)
MCV: 87.7 fL (ref 78.0–100.0)
MONOS PCT: 5 %
Monocytes Absolute: 0.4 10*3/uL (ref 0.1–1.0)
NEUTROS ABS: 3.5 10*3/uL (ref 1.7–7.7)
NEUTROS PCT: 43 %
PLATELETS: 227 10*3/uL (ref 150–400)
RBC: 4.22 MIL/uL (ref 3.87–5.11)
RDW: 13.2 % (ref 11.5–15.5)
WBC: 8.2 10*3/uL (ref 4.0–10.5)

## 2018-04-02 LAB — I-STAT BETA HCG BLOOD, ED (MC, WL, AP ONLY)

## 2018-04-02 MED ORDER — SODIUM CHLORIDE 0.9 % IV BOLUS
1000.0000 mL | Freq: Once | INTRAVENOUS | Status: AC
  Administered 2018-04-03: 1000 mL via INTRAVENOUS

## 2018-04-02 MED ORDER — DIPHENHYDRAMINE HCL 50 MG/ML IJ SOLN
12.5000 mg | Freq: Once | INTRAMUSCULAR | Status: AC
  Administered 2018-04-03: 12.5 mg via INTRAVENOUS
  Filled 2018-04-02: qty 1

## 2018-04-02 MED ORDER — MAGNESIUM SULFATE 2 GM/50ML IV SOLN
2.0000 g | Freq: Once | INTRAVENOUS | Status: AC
  Administered 2018-04-03: 2 g via INTRAVENOUS
  Filled 2018-04-02: qty 50

## 2018-04-02 MED ORDER — METOCLOPRAMIDE HCL 5 MG/ML IJ SOLN
10.0000 mg | Freq: Once | INTRAMUSCULAR | Status: AC
  Administered 2018-04-03: 10 mg via INTRAVENOUS
  Filled 2018-04-02: qty 2

## 2018-04-02 MED ORDER — DEXAMETHASONE SODIUM PHOSPHATE 10 MG/ML IJ SOLN
10.0000 mg | Freq: Once | INTRAMUSCULAR | Status: AC
  Administered 2018-04-03: 10 mg via INTRAVENOUS
  Filled 2018-04-02: qty 1

## 2018-04-02 NOTE — ED Notes (Signed)
Patient transported to CT 

## 2018-04-02 NOTE — ED Notes (Signed)
Pt reports a migraine since Monday with no relief. Pt reports she has been taking Goody powders for the migraine. Pt states she has MS and gets these migraines occasionally but they usually go away. Pt also reports she fell today and has some problems getting up from the fall.

## 2018-04-02 NOTE — ED Triage Notes (Signed)
Patient arrived with EMS from home reports migraine flare up onset 3 days ago unrelieved by OTC medications , emesis x1 today with photophobia/audiophobia .

## 2018-04-03 ENCOUNTER — Encounter (HOSPITAL_COMMUNITY): Payer: Self-pay | Admitting: Emergency Medicine

## 2018-04-03 MED ORDER — KETOROLAC TROMETHAMINE 30 MG/ML IJ SOLN
30.0000 mg | Freq: Once | INTRAMUSCULAR | Status: AC
  Administered 2018-04-03: 30 mg via INTRAVENOUS
  Filled 2018-04-03: qty 1

## 2018-04-03 NOTE — ED Notes (Signed)
Pt ambulates in hallway with no assistance. Pt walks with normal steady gait for 30 paces. Sensitivity to light resolved.

## 2018-04-03 NOTE — ED Provider Notes (Signed)
[facility] EMERGENCY DEPARTMENT Provider Note   CSN: [id number] Arrival date & time: 04/02/18  2108     History   Chief Complaint Chief Complaint  Patient presents with  . Migraine    HPI Beth Santos is a 27 y.o. female.  The history is provided by the patient.  Migraine  This is a recurrent problem. The current episode started more than 2 days ago. The problem occurs constantly. The problem has not changed since onset.Associated symptoms include headaches. Pertinent negatives include no chest pain, no abdominal pain and no shortness of breath. Nothing aggravates the symptoms. Nothing relieves the symptoms. Treatments tried: goody powder. The treatment provided no relief.  Patient has daily headaches this one is worse and it has been months since she had one this severe.  No weakness or numbness. No changes in vision nor speech nor thinking.  No f/c/r.  No neck pain.    Past Medical History:  Diagnosis Date  . Anxiety   . Dental crown present    right upper  . Depression   . History of dislocation    of jaw  . History of seizure    age 28 - due to intentional overdose of multiple medications  . History of suicide attempt as a teenager  . Multiple sclerosis (HCC)   . Poor venous access     Patient Active Problem List   Diagnosis Date Noted  . Pneumothorax 03/23/2015  . Relapsing remitting multiple sclerosis (HCC) 02/15/2015  . Optic neuritis 07/28/2014  . Vision loss of left eye     Past Surgical History:  Procedure Laterality Date  . APPENDECTOMY    . CHEST TUBE INSERTION Left 03/23/2015   Dr. Lindie Spruce  . Port-a-cath placement Left 03/22/2015   Dr. Corliss Skains  . PORTACATH PLACEMENT N/A 03/22/2015   Procedure: LEFT SUBCLAVIAN VEIN PORT PLACEMENT;  Surgeon: Manus Rudd, MD;  Location: [facility];  Service: General;  Laterality: N/A;     OB History   None      Home Medications    Prior to Admission medications   Medication Sig  Start Date End Date Taking? Authorizing Provider  natalizumab (TYSABRI) 300 MG/15ML injection Inject into the vein.    [provider]  norgestimate-ethinyl estradiol (ORTHO-CYCLEN,SPRINTEC,PREVIFEM) 0.25-35 MG-MCG tablet Take 1 tablet by mouth daily.    [provider]    Family History Family History  Problem Relation Age of Onset  . Rheum arthritis Mother   . Diabetes Father   . Cancer Maternal Grandmother        renal cell   . Diabetes Maternal Grandfather   . Hypertension Maternal Grandfather   . Alzheimer's disease Maternal Grandfather   . Cancer Paternal Grandmother        lung    Social History Social History   Tobacco Use  . Smoking status: Current Every Day Smoker    Packs/day: 1.00    Types: Cigarettes  . Smokeless tobacco: Never Used  Substance Use Topics  . Alcohol use: Yes    Alcohol/week: 0.0 oz    Comment: occasionally  . Drug use: No     Allergies   Oxycodone-acetaminophen and Other   Review of Systems Review of Systems  Eyes: Negative for redness and visual disturbance.  Respiratory: Negative for shortness of breath.   Cardiovascular: Negative for chest pain.  Gastrointestinal: Negative for abdominal pain and vomiting.  Musculoskeletal: Negative for neck pain and neck stiffness.  Skin: Negative for  rash.  Neurological: Positive for headaches. Negative for dizziness, tremors, seizures, syncope, facial asymmetry, speech difficulty, weakness, light-headedness and numbness.  All other systems reviewed and are negative.    Physical Exam Updated Vital Signs BP 121/70   Pulse (!) 58   Temp 98.8 F (37.1 C) (Oral)   Resp 14   LMP 03/12/2018 (Approximate)   SpO2 100%   Physical Exam  Constitutional: She is oriented to person, place, and time. She appears well-developed and well-nourished.  HENT:  Head: Normocephalic and atraumatic.  Nose: Nose normal.  Mouth/Throat: No oropharyngeal exudate.  No proptosis, intact cognition   Eyes: Pupils are equal, round, and reactive to light. Conjunctivae and EOM are normal.  Neck: Normal range of motion. Neck supple.  Cardiovascular: Normal rate, regular rhythm, normal heart sounds and intact distal pulses.  Pulmonary/Chest: Effort normal and breath sounds normal. No stridor. She has no wheezes. She has no rales.  Abdominal: Soft. Bowel sounds are normal. She exhibits no mass. There is no tenderness. There is no rebound and no guarding.  Musculoskeletal: Normal range of motion.  Lymphadenopathy:    She has no cervical adenopathy.  Neurological: She is alert and oriented to person, place, and time. She displays normal reflexes. No cranial nerve deficit or sensory deficit. She exhibits normal muscle tone. Coordination normal.  Skin: Skin is warm and dry. Capillary refill takes less than 2 seconds.  Psychiatric: She has a normal mood and affect.  Nursing note and vitals reviewed.    ED Treatments / Results  Labs (all labs ordered are listed, but only abnormal results are displayed) Results for orders placed or performed during the hospital encounter of 04/02/18  CBC with Differential  Result Value Ref Range   WBC 8.2 4.0 - 10.5 K/uL   RBC 4.22 3.87 - 5.11 MIL/uL   Hemoglobin 12.1 12.0 - 15.0 g/dL   HCT 16.1 09.6 - 04.5 %   MCV 87.7 78.0 - 100.0 fL   MCH 28.7 26.0 - 34.0 pg   MCHC 32.7 30.0 - 36.0 g/dL   RDW 40.9 81.1 - 91.4 %   Platelets 227 150 - 400 K/uL   Neutrophils Relative % 43 %   Neutro Abs 3.5 1.7 - 7.7 K/uL   Lymphocytes Relative 49 %   Lymphs Abs 4.0 0.7 - 4.0 K/uL   Monocytes Relative 5 %   Monocytes Absolute 0.4 0.1 - 1.0 K/uL   Eosinophils Relative 2 %   Eosinophils Absolute 0.2 0.0 - 0.7 K/uL   Basophils Relative 1 %   Basophils Absolute 0.1 0.0 - 0.1 K/uL   Immature Granulocytes 0 %   Abs Immature Granulocytes 0.0 0.0 - 0.1 K/uL  Basic metabolic panel  Result Value Ref Range   Sodium 140 135 - 145 mmol/L   Potassium 4.6 3.5 - 5.1 mmol/L    Chloride 107 98 - 111 mmol/L   CO2 25 22 - 32 mmol/L   Glucose, Bld 99 70 - 99 mg/dL   BUN 10 6 - 20 mg/dL   Creatinine, Ser 7.82 0.44 - 1.00 mg/dL   Calcium 9.1 8.9 - 95.6 mg/dL   GFR calc non Af Amer >60 >60 mL/min   GFR calc Af Amer >60 >60 mL/min   Anion gap 8 5 - 15  I-Stat Beta hCG blood, ED ([facility], [facility], [facility] only)  Result Value Ref Range   I-stat hCG, quantitative <5.0 <5 mIU/mL   Comment 3  Ct Head Wo Contrast  Result Date: 04/03/2018 CLINICAL DATA:  Migraines for 3 days. Difficulty standing. Vomiting. History of migraines, seizures, and MS. EXAM: CT HEAD WITHOUT CONTRAST TECHNIQUE: Contiguous axial images were obtained from the base of the skull through the vertex without intravenous contrast. COMPARISON:  MRI brain 08/15/2015 FINDINGS: Brain: No evidence of acute infarction, hemorrhage, hydrocephalus, extra-axial collection or mass lesion/mass effect. Vague white matter changes in the periventricular white matter over the left posterior ventricular horn and left frontal region are better depicted at MRI. Vascular: No hyperdense vessel or unexpected calcification. Skull: Normal. Negative for fracture or focal lesion. Sinuses/Orbits: No acute finding. Other: None. IMPRESSION: No acute intracranial abnormality. Vague white matter changes on the left consistent with known MS. These changes are better visualized at prior MRI. Electronically Signed   By: Burman Nieves M.D.   On: 04/03/2018 00:04    Radiology Ct Head Wo Contrast  Result Date: 04/03/2018 CLINICAL DATA:  Migraines for 3 days. Difficulty standing. Vomiting. History of migraines, seizures, and MS. EXAM: CT HEAD WITHOUT CONTRAST TECHNIQUE: Contiguous axial images were obtained from the base of the skull through the vertex without intravenous contrast. COMPARISON:  MRI brain 08/15/2015 FINDINGS: Brain: No evidence of acute infarction, hemorrhage, hydrocephalus, extra-axial collection or mass lesion/mass effect. Vague white  matter changes in the periventricular white matter over the left posterior ventricular horn and left frontal region are better depicted at MRI. Vascular: No hyperdense vessel or unexpected calcification. Skull: Normal. Negative for fracture or focal lesion. Sinuses/Orbits: No acute finding. Other: None. IMPRESSION: No acute intracranial abnormality. Vague white matter changes on the left consistent with known MS. These changes are better visualized at prior MRI. Electronically Signed   By: Burman Nieves M.D.   On: 04/03/2018 00:04    Procedures Procedures (including critical care time)  Medications Ordered in ED Medications  dexamethasone (DECADRON) injection 10 mg (has no administration in time range)  sodium chloride 0.9 % bolus 1,000 mL (has no administration in time range)  metoCLOPramide (REGLAN) injection 10 mg (has no administration in time range)  diphenhydrAMINE (BENADRYL) injection 12.5 mg (has no administration in time range)  magnesium sulfate IVPB 2 g 50 mL (has no administration in time range)     Walked without difficulty in the hallway, pain is markedly improved.   Final Clinical Impressions(s) / ED Diagnoses   Follow up with your MS specialist  Return for pain, numbness, changes in vision or speech, fevers >100.4 unrelieved by medication, shortness of breath, intractable vomiting, or diarrhea, abdominal pain, Inability to tolerate liquids or food, cough, altered mental status or any concerns. No signs of systemic illness or infection. The patient is nontoxic-appearing on exam and vital signs are within normal limits. Will refer to urology for microscopy hematuria as patient is asymptomatic.  I have reviewed the triage vital signs and the nursing notes. Pertinent labs &imaging results that were available during my care of the patient were reviewed by me and considered in my medical decision making (see chart for details).  After history, exam, and medical workup I  feel the patient has been appropriately medically screened and is safe for discharge home. Pertinent diagnoses were discussed with the patient. Patient was given return precautions.   Desia Saban, MD 04/03/18 0111

## 2018-04-22 ENCOUNTER — Encounter (HOSPITAL_COMMUNITY): Payer: BLUE CROSS/BLUE SHIELD | Attending: Neurology

## 2018-04-22 DIAGNOSIS — G35 Multiple sclerosis: Secondary | ICD-10-CM | POA: Insufficient documentation

## 2018-04-23 NOTE — Progress Notes (Signed)
NEUROLOGY FOLLOW UP OFFICE NOTE  Beth Santos [id number]  HISTORY OF PRESENT ILLNESS: Beth Santos is a 27 year old right-handed woman with anxiety and history of drug-induced seizure at age 61 who follows up for relapsing-remitting multiple sclerosis.  UPDATE: She is on Tysabri.  She missed her last Tysabri session on Tuesday because she feels fatigued.  Not compliant with vitamin D.   01/21/18 LABS:  JCV antibody negative; D 1,25-hydroxy 40; hepatic panel with t bili 0.5, ALP 81, AST 20, ALT 19. 04/02/18 CBC with diff:  WBC 8.2, HGB 12.1, HCT 37, PLT 227, ALC 4 K/uL  Vision:  okay Motor:  Okay Sensory:  No Pain:  She has a dull headache which is chronic.  Two weeks ago, she developed a severe pressure-like headache (bilateral retro-orbital), she would fall when she got up.  She reports photophobia, nausea and vomiting.  After 3 days, she went to the ED on 04/03/18.  CT of head showed chronic wihie matter changes in the left hemisphere likely consistent with her known MS.  She was treated with migraine cocktail. Gait:  Okay Bowel/bladder:  Okay Fatigue:  Yes.  She missed her Tysabri session on Tuesday because of fatigue. Cognition:  Reports difficulty concentration.  She says it takes longer to read. She knows what she is reading but she has to read it again. Eye exam is unremarkable.   Mood:  Depressed.  HISTORY: On 07/27/14, she had left optic neuritis. She had an MRI of the brain with and without contrast, which showed multiple hyperdense non-enhancing lesions in the supratentorial white matter with 5 mm enhancing lesion in the left occipital lobe. There was associated asymmetric enhancement of the left optic nerve. Labs showed ANA negative, Sed Rate 18, RPR non-reactive, TSH 6.060, free T4 1, HIV 1&2 antibodies non-reactive, and unremarkable CBC with diff. She was treated with Solumedrol 1000mg  for 5 days. Vision improved.  She has a history numbness in the left leg off and on  for some time. She also will sometimes experience tingling in the right foot. She notes cold may exacerbate symptoms. She reports feeling tired but no significant fatigue. She occasionally notes twitching in her left temple. She denies muscle cramps or gait instability. She does not have family history of MS.  She started Tysabri in March 2016.  MRI of the cervical spine with and without contrast performed on 10/01/14 showed subtle T2 hyperintense lesions at the level of C3-4 and C5, without enhancement. Repeat MRI of the brain and cervical spine with and without contrast performed on 03/03/15 showed new 12 mm non-enhancing lesion in left parietal lobe and possible new tiny nonenhancing lesion at the C7 level of spinal cord, however she had trouble receiving Tysabri because they could not get a line.  She now has a port. MRI of brain and cervical spine with and without contradt from 08/15/15 showed no acute demyelination or changes from prior imaging in June 2016.  PAST MEDICAL HISTORY: Past Medical History:  Diagnosis Date  . Anxiety   . Dental crown present    right upper  . Depression   . History of dislocation    of jaw  . History of seizure    age 27 - due to intentional overdose of multiple medications  . History of suicide attempt as a teenager  . Multiple sclerosis (HCC)   . Poor venous access     MEDICATIONS: Current Outpatient Medications on File Prior to Visit  Medication Sig Dispense  Refill  . natalizumab (TYSABRI) 300 MG/15ML injection Inject 300 mg into the vein every 28 (twenty-eight) days.     . norgestimate-ethinyl estradiol (ORTHO-CYCLEN,SPRINTEC,PREVIFEM) 0.25-35 MG-MCG tablet Take 1 tablet by mouth daily.     Current Facility-Administered Medications on File Prior to Visit  Medication Dose Route Frequency Provider Last Rate Last Dose  . natalizumab (TYSABRI) 300 mg in sodium chloride 0.9 % 100 mL IVPB  300 mg Intravenous Q28 days Shon Millet R, DO         ALLERGIES: Allergies  Allergen Reactions  . Oxycodone-Acetaminophen Nausea And Vomiting  . Other Rash    No antibiotics as it conflicts with therapy Stainless Steel- RASH    FAMILY HISTORY: Family History  Problem Relation Age of Onset  . Rheum arthritis Mother   . Diabetes Father   . Cancer Maternal Grandmother        renal cell   . Diabetes Maternal Grandfather   . Hypertension Maternal Grandfather   . Alzheimer's disease Maternal Grandfather   . Cancer Paternal Grandmother        lung    SOCIAL HISTORY: Social History   Socioeconomic History  . Marital status: Married    Spouse name: Not on file  . Number of children: Not on file  . Years of education: Not on file  . Highest education level: Not on file  Occupational History  . Not on file  Social Needs  . Financial resource strain: Not on file  . Food insecurity:    Worry: Not on file    Inability: Not on file  . Transportation needs:    Medical: Not on file    Non-medical: Not on file  Tobacco Use  . Smoking status: Current Every Day Smoker    Packs/day: 1.00    Types: Cigarettes  . Smokeless tobacco: Never Used  Substance and Sexual Activity  . Alcohol use: Yes    Alcohol/week: 0.0 standard drinks    Comment: occasionally  . Drug use: No  . Sexual activity: Yes    Partners: Male    Birth control/protection: None  Lifestyle  . Physical activity:    Days per week: Not on file    Minutes per session: Not on file  . Stress: Not on file  Relationships  . Social connections:    Talks on phone: Not on file    Gets together: Not on file    Attends religious service: Not on file    Active member of club or organization: Not on file    Attends meetings of clubs or organizations: Not on file    Relationship status: Not on file  . Intimate partner violence:    Fear of current or ex partner: Not on file    Emotionally abused: Not on file    Physically abused: Not on file    Forced sexual  activity: Not on file  Other Topics Concern  . Not on file  Social History Narrative  . Not on file    REVIEW OF SYSTEMS: Constitutional: No fevers, chills, or sweats, no generalized fatigue, change in appetite Eyes: No visual changes, double vision, eye pain Ear, nose and throat: No hearing loss, ear pain, nasal congestion, sore throat Cardiovascular: No chest pain, palpitations Respiratory:  No shortness of breath at rest or with exertion, wheezes GastrointestinaI: No nausea, vomiting, diarrhea, abdominal pain, fecal incontinence Genitourinary:  No dysuria, urinary retention or frequency Musculoskeletal:  No neck pain, back pain Integumentary: No rash, pruritus,  skin lesions Neurological: as above Psychiatric: No depression, insomnia, anxiety Endocrine: No palpitations, fatigue, diaphoresis, mood swings, change in appetite, change in weight, increased thirst Hematologic/Lymphatic:  No purpura, petechiae. Allergic/Immunologic: no itchy/runny eyes, nasal congestion, recent allergic reactions, rashes  PHYSICAL EXAM: Blood pressure 106/68, pulse 69, height 5\' 10"  (1.778 m), weight 184 lb (83.5 kg), SpO2 99 %. General: No acute distress.  Patient appears well-groomed.   Head:  Normocephalic/atraumatic Eyes:  Fundi examined but not visualized Neck: supple, no paraspinal tenderness, full range of motion Heart:  Regular rate and rhythm Lungs:  Clear to auscultation bilaterally Back: No paraspinal tenderness Neurological Exam: alert and oriented to person, place, and time. Attention span and concentration intact, recent and remote memory intact, fund of knowledge intact.  Speech fluent and not dysarthric, language intact.  Mildly reduced right V1-V3 sensation.  Otherwise, CN II-XII intact. Bulk and tone normal, muscle strength 5/5 throughout.  Vague reduced pinprick sensation in right arm and leg.  Vibration sensation intact.  Deep tendon reflexes 2+ throughout, toes downgoing.  Finger to  nose and heel to shin testing intact.  Gait normal, Romberg negative.  IMPRESSION: Relapsing-remitting MS Depression  PLAN: 1.  Start escitalopram 10mg  daily for depression.  We will monitor to see if it is the cause of her fatigue. 2.  Continue Tysabri.  Advised to make follow up appointment immediately 3.  She has not had requested repeat imaging in the past.  Due to that as well as new symptoms, she is to have MRI of brain, cervical and thoracic spine with and without contrast.  I told her if she does not have it done, we will need to stop Tysabri. 4.  Recheck JCV antibody with index in May.  CBC with diff in 6 months. 5.  Follow up in 6 months.  26 minutes spent face to face with patient, over 50% spent discussing management.  Shon Millet, DO

## 2018-04-24 ENCOUNTER — Ambulatory Visit (INDEPENDENT_AMBULATORY_CARE_PROVIDER_SITE_OTHER): Payer: BLUE CROSS/BLUE SHIELD | Admitting: Neurology

## 2018-04-24 ENCOUNTER — Encounter: Payer: Self-pay | Admitting: Neurology

## 2018-04-24 VITALS — BP 106/68 | HR 69 | Ht 70.0 in | Wt 184.0 lb

## 2018-04-24 DIAGNOSIS — F32A Depression, unspecified: Secondary | ICD-10-CM

## 2018-04-24 DIAGNOSIS — G35 Multiple sclerosis: Secondary | ICD-10-CM | POA: Diagnosis not present

## 2018-04-24 DIAGNOSIS — Z79899 Other long term (current) drug therapy: Secondary | ICD-10-CM

## 2018-04-24 DIAGNOSIS — F329 Major depressive disorder, single episode, unspecified: Secondary | ICD-10-CM

## 2018-04-24 MED ORDER — ESCITALOPRAM OXALATE 10 MG PO TABS: 10.0000 mg | ORAL_TABLET | Freq: Every day | ORAL | 5 refills | Status: DC

## 2018-04-24 NOTE — Patient Instructions (Addendum)
1.  Start escitalopram (Lexapro) 10mg  daily 2.  Continue Tysabri.  Make follow up appointment immediately 3.  We will check MRI of brain/cervical spine with and without contrast 4.  Recheck JCV antibody in Novemeber, CBC with diff in 6 months. 5.  Follow up in 6 months.  We have sent a referral to [facility] for your MRI and they will call you directly to schedule your appt. If you need to contact them directly please call [phone]

## 2018-05-11 DIAGNOSIS — G514 Facial myokymia: Secondary | ICD-10-CM

## 2018-05-11 HISTORY — DX: Facial myokymia: G51.4

## 2018-05-26 ENCOUNTER — Other Ambulatory Visit: Payer: Self-pay

## 2018-05-26 ENCOUNTER — Telehealth: Payer: Self-pay | Admitting: Neurology

## 2018-05-26 NOTE — Telephone Encounter (Signed)
Melissa from [employer] called wanting a prior auth done for the patient's Tysabri medication. Please call her back at [phone]. Thanks!

## 2018-05-26 NOTE — Telephone Encounter (Signed)
Called and spoke with Sterling to ensure fax I sent earlier today has been rcvd, it has not been rcvd.   Rcvd approval for Tysabri by fax 05/26/18 - 11/24/18  Called short stay spoke with Selena Batten, advised her.

## 2018-05-28 ENCOUNTER — Encounter (HOSPITAL_COMMUNITY)
Admission: RE | Admit: 2018-05-28 | Discharge: 2018-05-28 | Disposition: A | Discharge status: Home / Self Care | Payer: BLUE CROSS/BLUE SHIELD | Source: Ambulatory Visit | Attending: Neurology | Admitting: Neurology

## 2018-05-28 ENCOUNTER — Encounter (HOSPITAL_COMMUNITY): Payer: Self-pay

## 2018-05-28 DIAGNOSIS — G35 Multiple sclerosis: Secondary | ICD-10-CM | POA: Insufficient documentation

## 2018-05-28 HISTORY — DX: Facial myokymia: G51.4

## 2018-05-28 MED ORDER — SODIUM CHLORIDE 0.9 % IV SOLN
INTRAVENOUS | Status: DC
  Administered 2018-05-28: 12:00:00 via INTRAVENOUS

## 2018-05-28 MED ORDER — HEPARIN SOD (PORK) LOCK FLUSH 100 UNIT/ML IV SOLN
INTRAVENOUS | Status: AC
  Administered 2018-05-28: 500 [IU]
  Filled 2018-05-28: qty 5

## 2018-05-28 MED ORDER — HEPARIN SOD (PORK) LOCK FLUSH 100 UNIT/ML IV SOLN: 500.0000 [IU] | INTRAVENOUS | Status: DC | PRN

## 2018-05-28 MED ORDER — SODIUM CHLORIDE 0.9 % IV SOLN
300.0000 mg | INTRAVENOUS | Status: DC
  Administered 2018-05-28: 300 mg via INTRAVENOUS
  Filled 2018-05-28: qty 15

## 2018-06-25 ENCOUNTER — Encounter (HOSPITAL_COMMUNITY): Payer: BLUE CROSS/BLUE SHIELD

## 2018-07-23 ENCOUNTER — Encounter (HOSPITAL_COMMUNITY): Payer: BLUE CROSS/BLUE SHIELD

## 2018-08-20 ENCOUNTER — Encounter (HOSPITAL_COMMUNITY): Payer: BLUE CROSS/BLUE SHIELD

## 2018-10-29 NOTE — Progress Notes (Signed)
NEUROLOGY FOLLOW UP OFFICE NOTE  Beth Santos [id number]  HISTORY OF PRESENT ILLNESS: Beth Santos is a 28 year old right-handed Caucasian woman with anxiety and history of drug-induced seizure at age 7 who follows up for multiple sclerosis.  UPDATE: She hasn't received Tysabri since September.  She was on vacation in October.  She missed in November due to the holidays.  In December, she was sick.  She hasn't been taking the D3 daily.  Vision: No issues Motor: No issues Sensory: mild facial numbness and right hand numbness. Pain: No issues Gait: No issues Bowel/Bladder: No issues Fatigue: Yes Cognition: Mild difficulty concentrating Mood: Depressed  HISTORY: On 07/27/14, she had left optic neuritis.She had an MRI of the brain with and without contrast, which showed multiple hyperdense non-enhancing lesions in the supratentorial white matter with 5 mm enhancing lesion in the left occipital lobe.There was associated asymmetric enhancement of the left optic nerve.Labs showed ANA negative, Sed Rate 18, RPR non-reactive, TSH 6.060, free T4 1, HIV 1&2 antibodies non-reactive, and unremarkable CBC with diff.She was treated with Solumedrol 1000mg  for 5 days.Vision improved.  She has a history numbness in the left leg off and on for some time.She also will sometimes experience tingling in the right foot.She notes cold may exacerbate symptoms.She reports feeling tired but no significant fatigue.She occasionally notes twitching in her left temple.She denies muscle cramps or gait instability.She does not have family history of MS.  She started Tysabri in March 2016.  MRI of the cervical spine with and without contrast performed on 10/01/14 showed subtle T2 hyperintense lesions at the level of C3-4 and C5, without enhancement. Repeat MRI of the brain and cervical spine with and without contrast performed on 03/03/15 showed new 12 mm non-enhancing lesion in left parietal  lobe and possible new tiny nonenhancing lesion at the C7 level of spinal cord, however she had trouble receiving Tysabri because they could not get a line. She now has a port. MRI of brain and cervical spine with and without contradt from 08/15/15 showed no acute demyelination or changes from prior imaging in June 2016.  PAST MEDICAL HISTORY: Past Medical History:  Diagnosis Date  . Anxiety   . Dental crown present    right upper  . Depression   . Facial twitching 05/2018  . History of dislocation    of jaw  . History of seizure    age 62 - due to intentional overdose of multiple medications  . History of suicide attempt as a teenager  . Multiple sclerosis (HCC)   . Poor venous access     MEDICATIONS: Current Outpatient Medications on File Prior to Visit  Medication Sig Dispense Refill  . cholecalciferol (VITAMIN D) 1000 units tablet Take 400 Units by mouth daily.    Marland Kitchen escitalopram (LEXAPRO) 10 MG tablet Take 1 tablet (10 mg total) by mouth daily. 30 tablet 5  . natalizumab (TYSABRI) 300 MG/15ML injection Inject 300 mg into the vein every 28 (twenty-eight) days.     . norgestimate-ethinyl estradiol (ORTHO-CYCLEN,SPRINTEC,PREVIFEM) 0.25-35 MG-MCG tablet Take 1 tablet by mouth daily.     No current facility-administered medications on file prior to visit.     ALLERGIES: Allergies  Allergen Reactions  . Oxycodone-Acetaminophen Nausea And Vomiting  . Other Rash    No antibiotics as it conflicts with therapy Stainless Steel- RASH    FAMILY HISTORY: Family History  Problem Relation Age of Onset  . Rheum arthritis Mother   . Diabetes Father   .  Cancer Maternal Grandmother        renal cell   . Diabetes Maternal Grandfather   . Hypertension Maternal Grandfather   . Alzheimer's disease Maternal Grandfather   . Cancer Paternal Grandmother        lung   SOCIAL HISTORY: Social History   Socioeconomic History  . Marital status: Married    Spouse name: Not on file  .  Number of children: Not on file  . Years of education: Not on file  . Highest education level: Not on file  Occupational History  . Not on file  Social Needs  . Financial resource strain: Not on file  . Food insecurity:    Worry: Not on file    Inability: Not on file  . Transportation needs:    Medical: Not on file    Non-medical: Not on file  Tobacco Use  . Smoking status: Former Smoker    Packs/day: 1.00    Types: Cigarettes    Last attempt to quit: 02/22/2018    Years since quitting: 0.6  . Smokeless tobacco: Never Used  Substance and Sexual Activity  . Alcohol use: Yes    Alcohol/week: 0.0 standard drinks    Comment: occasionally  . Drug use: No  . Sexual activity: Yes    Partners: Male    Birth control/protection: None  Lifestyle  . Physical activity:    Days per week: Not on file    Minutes per session: Not on file  . Stress: Not on file  Relationships  . Social connections:    Talks on phone: Not on file    Gets together: Not on file    Attends religious service: Not on file    Active member of club or organization: Not on file    Attends meetings of clubs or organizations: Not on file    Relationship status: Not on file  . Intimate partner violence:    Fear of current or ex partner: Not on file    Emotionally abused: Not on file    Physically abused: Not on file    Forced sexual activity: Not on file  Other Topics Concern  . Not on file  Social History Narrative   New lethargy, md aware    REVIEW OF SYSTEMS: Constitutional: No fevers, chills, or sweats, no generalized fatigue, change in appetite Eyes: No visual changes, double vision, eye pain Ear, nose and throat: No hearing loss, ear pain, nasal congestion, sore throat Cardiovascular: No chest pain, palpitations Respiratory:  No shortness of breath at rest or with exertion, wheezes GastrointestinaI: No nausea, vomiting, diarrhea, abdominal pain, fecal incontinence Genitourinary:  No dysuria, urinary  retention or frequency Musculoskeletal:  No neck pain, back pain Integumentary: No rash, pruritus, skin lesions Neurological: as above Psychiatric: No depression, insomnia, anxiety Endocrine: No palpitations, fatigue, diaphoresis, mood swings, change in appetite, change in weight, increased thirst Hematologic/Lymphatic:  No purpura, petechiae. Allergic/Immunologic: no itchy/runny eyes, nasal congestion, recent allergic reactions, rashes  PHYSICAL EXAM: Blood pressure 106/78, pulse 64, height 5\' 10"  (1.778 m), weight 180 lb (81.6 kg), SpO2 99 %. General: No acute distress.  Patient appears well-groomed.   Head:  Normocephalic/atraumatic Eyes:  Fundi examined but not visualized Neck: supple, no paraspinal tenderness, full range of motion Heart:  Regular rate and rhythm Lungs:  Clear to auscultation bilaterally Back: No paraspinal tenderness Neurological Exam: alert and oriented to person, place, and time. Attention span and concentration intact, recent and remote memory intact, fund of knowledge  intact.  Speech fluent and not dysarthric, language intact.  Endorses mild left V1-V2 and right V3 numbness.  Otherwise, CN II-XII intact. Bulk and tone normal, muscle strength 5/5 throughout.  Sensation to pinprick slightly reduced in right hand; vibration intact.  Deep tendon reflexes 2+ throughout, toes downgoing.  Finger to nose and heel to shin testing intact.  Gait normal, Romberg negative.  IMPRESSION: Multiple sclerosis.   PLAN: 1.  Check CBC with diff and JC Virus antibody with titer today. 2.  Check CBC with diff, JC Virus antibody with titer and vitamin D level in 6 months. 3.  Check MRI of brain and cervical spine with and without contrast 4.  Set up again for Tysabri unless JC Virus antibody with high titer. 5.  Take D3 4000 IU daily 6.  Follow up in 6 months after repeat labs.  17 minutes spent face to face with patient, over 50% spent discussing importance of medication adherence  and management.  Shon Millet, DO

## 2018-10-31 ENCOUNTER — Encounter: Payer: Self-pay | Admitting: Neurology

## 2018-10-31 ENCOUNTER — Other Ambulatory Visit (INDEPENDENT_AMBULATORY_CARE_PROVIDER_SITE_OTHER): Payer: BLUE CROSS/BLUE SHIELD

## 2018-10-31 ENCOUNTER — Ambulatory Visit: Payer: BLUE CROSS/BLUE SHIELD | Admitting: Neurology

## 2018-10-31 VITALS — BP 106/78 | HR 64 | Ht 70.0 in | Wt 180.0 lb

## 2018-10-31 DIAGNOSIS — G35 Multiple sclerosis: Secondary | ICD-10-CM | POA: Diagnosis not present

## 2018-10-31 DIAGNOSIS — Z79899 Other long term (current) drug therapy: Secondary | ICD-10-CM

## 2018-10-31 LAB — CBC WITH DIFFERENTIAL/PLATELET
Basophils Absolute: 0 10*3/uL (ref 0.0–0.1)
Basophils Relative: 0.7 % (ref 0.0–3.0)
Eosinophils Absolute: 0.1 10*3/uL (ref 0.0–0.7)
Eosinophils Relative: 2.1 % (ref 0.0–5.0)
HCT: 37.4 % (ref 36.0–46.0)
Hemoglobin: 12.6 g/dL (ref 12.0–15.0)
LYMPHS ABS: 2.6 10*3/uL (ref 0.7–4.0)
Lymphocytes Relative: 44.2 % (ref 12.0–46.0)
MCHC: 33.6 g/dL (ref 30.0–36.0)
MCV: 85 fl (ref 78.0–100.0)
MONO ABS: 0.4 10*3/uL (ref 0.1–1.0)
MONOS PCT: 7.4 % (ref 3.0–12.0)
NEUTROS ABS: 2.7 10*3/uL (ref 1.4–7.7)
NEUTROS PCT: 45.6 % (ref 43.0–77.0)
PLATELETS: 259 10*3/uL (ref 150.0–400.0)
RBC: 4.4 Mil/uL (ref 3.87–5.11)
RDW: 14.9 % (ref 11.5–15.5)
WBC: 6 10*3/uL (ref 4.0–10.5)

## 2018-10-31 NOTE — Addendum Note (Signed)
Addended by: Dorthy Cooler on: 10/31/2018 11:18 AM   Modules accepted: Orders

## 2018-10-31 NOTE — Patient Instructions (Addendum)
1.  Check CBC with diff and JC Virus antibody with titer today. 2.  Check MRI of brain and cervical spine with and without contrast 3.  Recheck CBC with diff, JC Virus antibody with titer and vitamin D level in 6 months. 4.  Follow up after repeat labs in 6 months  Your provider has requested that you have labwork completed today. Please go to [facility] ([id number]) on the second floor of this building before leaving the office today. You do not need to check in. If you are not called within 15 minutes please check with the front desk.   We have sent a referral to [facility] for your MRI's and they will call you directly to schedule your appt. They are located at [address] [facility]. If you need to contact them directly please call [phone].

## 2018-11-06 LAB — STRATIFY JCV(TM) AB W/INDEX
JCV Antibody: NEGATIVE
JCV Index Value: 0.17

## 2018-11-14 ENCOUNTER — Ambulatory Visit
Admission: RE | Admit: 2018-11-14 | Discharge: 2018-11-14 | Disposition: A | Discharge status: Home / Self Care | Payer: BLUE CROSS/BLUE SHIELD | Source: Ambulatory Visit | Attending: Neurology | Admitting: Neurology

## 2018-11-14 DIAGNOSIS — G35 Multiple sclerosis: Secondary | ICD-10-CM

## 2018-11-14 MED ORDER — GADOBENATE DIMEGLUMINE 529 MG/ML IV SOLN
17.0000 mL | Freq: Once | INTRAVENOUS | Status: AC | PRN
  Administered 2018-11-14: 17 mL via INTRAVENOUS

## 2018-11-26 ENCOUNTER — Other Ambulatory Visit: Payer: Self-pay

## 2018-11-26 ENCOUNTER — Telehealth: Payer: Self-pay

## 2018-11-26 NOTE — Telephone Encounter (Signed)
Called Pt to advised her orders are placed at short stay [facility] for her Tysabri infusions, no answer, VM is full.  Called and spoke with [facility] at short stay to advise her orders are placed also.

## 2018-11-28 ENCOUNTER — Telehealth: Payer: Self-pay

## 2018-11-28 NOTE — Telephone Encounter (Signed)
Called Pt to advise her of MRI results, no answer, VM is full

## 2018-11-28 NOTE — Telephone Encounter (Signed)
-----   Message from Drema Dallas, DO sent at 11/17/2018  7:15 AM EDT ----- MRI is stable (no new MS lesions)

## 2018-11-28 NOTE — Progress Notes (Signed)
Rcvd Auth for [employer]

## 2018-12-01 ENCOUNTER — Other Ambulatory Visit (HOSPITAL_COMMUNITY): Payer: Self-pay | Admitting: General Practice

## 2018-12-01 ENCOUNTER — Other Ambulatory Visit: Payer: Self-pay

## 2018-12-01 NOTE — Telephone Encounter (Signed)
Spoke with Pt. Advised her of MRI results. Pt asked about a letter for her employer stating she is immunocompromised in the event she is unable to work. Per Dr. Everlena Cooper, OK to send letter

## 2018-12-01 NOTE — Telephone Encounter (Signed)
Patient is calling you back. Thanks!  °

## 2018-12-18 ENCOUNTER — Other Ambulatory Visit: Payer: Self-pay | Admitting: Neurology

## 2018-12-24 ENCOUNTER — Other Ambulatory Visit (HOSPITAL_COMMUNITY): Payer: Self-pay | Admitting: General Practice

## 2019-01-01 ENCOUNTER — Other Ambulatory Visit: Payer: Self-pay

## 2019-01-01 ENCOUNTER — Encounter (HOSPITAL_COMMUNITY): Payer: BLUE CROSS/BLUE SHIELD

## 2019-01-01 ENCOUNTER — Ambulatory Visit (HOSPITAL_COMMUNITY)
Admission: RE | Admit: 2019-01-01 | Discharge: 2019-01-01 | Disposition: A | Discharge status: Home / Self Care | Payer: BLUE CROSS/BLUE SHIELD | Source: Ambulatory Visit | Attending: Neurology | Admitting: Neurology

## 2019-01-01 DIAGNOSIS — G35 Multiple sclerosis: Secondary | ICD-10-CM | POA: Diagnosis present

## 2019-01-01 MED ORDER — ACETAMINOPHEN 325 MG PO TABS: 650.0000 mg | ORAL_TABLET | Freq: Once | ORAL | Status: DC

## 2019-01-01 MED ORDER — HEPARIN SOD (PORK) LOCK FLUSH 100 UNIT/ML IV SOLN
500.0000 [IU] | INTRAVENOUS | Status: AC | PRN
  Administered 2019-01-01: 500 [IU]
  Filled 2019-01-01: qty 5

## 2019-01-01 MED ORDER — EPINEPHRINE 0.3 MG/0.3ML IJ SOAJ
0.3000 mg | Freq: Once | INTRAMUSCULAR | Status: DC
  Filled 2019-01-01: qty 0.3

## 2019-01-01 MED ORDER — SODIUM CHLORIDE 0.9 % IV SOLN
INTRAVENOUS | Status: DC | PRN
  Administered 2019-01-01: 12:00:00 250 mL via INTRAVENOUS

## 2019-01-01 MED ORDER — SODIUM CHLORIDE 0.9% FLUSH
10.0000 mL | INTRAVENOUS | Status: AC | PRN
  Administered 2019-01-01: 10 mL

## 2019-01-01 MED ORDER — SODIUM CHLORIDE 0.9 % IV SOLN
300.0000 mg | INTRAVENOUS | Status: DC
  Administered 2019-01-01: 300 mg via INTRAVENOUS
  Filled 2019-01-01: qty 15

## 2019-01-01 MED ORDER — LORATADINE 10 MG PO TABS
10.0000 mg | ORAL_TABLET | Freq: Once | ORAL | Status: DC | PRN
  Filled 2019-01-01: qty 1

## 2019-01-01 NOTE — Progress Notes (Signed)
Patient received Tysabri via an implanted port that was accessed and de-accessed per protocol. Patient refused to wait for 60 minutes post infusion. Patient tolerated well, vitals stable and discharge instructions given. Patient verbalized understanding. Alert, oriented and ambulatory at the time of discharge.

## 2019-01-01 NOTE — Discharge Instructions (Signed)
Natalizumab injection What is this medicine? NATALIZUMAB (na ta LIZ you mab) is used to treat relapsing multiple sclerosis. This drug is not a cure. It is also used to treat Crohn's disease. This medicine may be used for other purposes; ask your health care provider or pharmacist if you have questions. COMMON BRAND NAME(S): Tysabri What should I tell my health care provider before I take this medicine? They need to know if you have any of these conditions: -immune system problems -progressive multifocal leukoencephalopathy (PML) -an unusual or allergic reaction to natalizumab, other medicines, foods, dyes, or preservatives -pregnant or trying to get pregnant -breast-feeding How should I use this medicine? This medicine is for infusion into a vein. It is given by a health care professional in a hospital or clinic setting. A special MedGuide will be given to you by the pharmacist with each prescription and refill. Be sure to read this information carefully each time. Talk to your pediatrician regarding the use of this medicine in children. This medicine is not approved for use in children. Overdosage: If you think you have taken too much of this medicine contact a poison control center or emergency room at once. NOTE: This medicine is only for you. Do not share this medicine with others. What if I miss a dose? It is important not to miss your dose. Call your doctor or health care professional if you are unable to keep an appointment. What may interact with this medicine? -azathioprine -cyclosporine -interferon -6-mercaptopurine -methotrexate -steroid medicines like prednisone or cortisone -TNF-alpha inhibitors like adalimumab, etanercept, and infliximab -vaccines This list may not describe all possible interactions. Give your health care provider a list of all the medicines, herbs, non-prescription drugs, or dietary supplements you use. Also tell them if you smoke, drink alcohol, or use  illegal drugs. Some items may interact with your medicine. What should I watch for while using this medicine? Your condition will be monitored carefully while you are receiving this medicine. Visit your doctor for regular check ups. Tell your doctor or healthcare professional if your symptoms do not start to get better or if they get worse. Stay away from people who are sick. Call your doctor or health care professional for advice if you get a fever, chills or sore throat, or other symptoms of a cold or flu. Do not treat yourself. In some patients, this medicine may cause a serious brain infection that may cause death. If you have any problems seeing, thinking, speaking, walking, or standing, tell your doctor right away. If you cannot reach your doctor, get urgent medical care. What side effects may I notice from receiving this medicine? Side effects that you should report to your doctor or health care professional as soon as possible: -allergic reactions like skin rash, itching or hives, swelling of the face, lips, or tongue -breathing problems -changes in vision -chest pain -dark urine -depression, feelings of sadness -dizziness -general ill feeling or flu-like symptoms -irregular, missed, or painful menstrual periods -light-colored stools -loss of appetite, nausea -muscle weakness -problems with balance, talking, or walking -right upper belly pain -unusually weak or tired -yellowing of the eyes or skin Side effects that usually do not require medical attention (report to your doctor or health care professional if they continue or are bothersome): -aches, pains -headache -stomach upset -tiredness This list may not describe all possible side effects. Call your doctor for medical advice about side effects. You may report side effects to FDA at 1-800-FDA-1088. Where should I keep   my medicine? This drug is given in a hospital or clinic and will not be stored at home. NOTE: This sheet is  a summary. It may not cover all possible information. If you have questions about this medicine, talk to your doctor, pharmacist, or health care provider.  2019 Elsevier/Gold Standard (2008-10-16 13:33:21)  

## 2019-01-29 ENCOUNTER — Ambulatory Visit (HOSPITAL_COMMUNITY)
Admission: RE | Admit: 2019-01-29 | Discharge: 2019-01-29 | Disposition: A | Discharge status: Home / Self Care | Payer: BLUE CROSS/BLUE SHIELD | Source: Ambulatory Visit | Attending: Neurology | Admitting: Neurology

## 2019-01-29 ENCOUNTER — Other Ambulatory Visit: Payer: Self-pay

## 2019-01-29 DIAGNOSIS — G35 Multiple sclerosis: Secondary | ICD-10-CM | POA: Diagnosis not present

## 2019-01-29 MED ORDER — SODIUM CHLORIDE 0.9% FLUSH: 10.0000 mL | INTRAVENOUS | Status: DC | PRN

## 2019-01-29 MED ORDER — HEPARIN SOD (PORK) LOCK FLUSH 100 UNIT/ML IV SOLN
500.0000 [IU] | INTRAVENOUS | Status: DC | PRN
  Filled 2019-01-29: qty 5

## 2019-01-29 MED ORDER — SODIUM CHLORIDE 0.9 % IV SOLN
300.0000 mg | INTRAVENOUS | Status: DC
  Administered 2019-01-29: 300 mg via INTRAVENOUS
  Filled 2019-01-29: qty 15

## 2019-01-29 MED ORDER — SODIUM CHLORIDE 0.9 % IV SOLN
INTRAVENOUS | Status: DC | PRN
  Administered 2019-01-29: 250 mL via INTRAVENOUS

## 2019-01-29 NOTE — Progress Notes (Signed)
PATIENT CARE CENTER NOTE  Diagnosis: Multiple Sclerosis    Provider: Shon Millet, MD   Procedure: Tysabri infusion    Note: Patient received Tysabri via PAC. Tolerated well with no adverse reaction. Patient did not want to stay 1 hour post-infusion. Vital signs stable. Discharge instructions given. Alert, oriented and ambulatory at discharge.

## 2019-01-29 NOTE — Discharge Instructions (Signed)
Natalizumab injection What is this medicine? NATALIZUMAB (na ta LIZ you mab) is used to treat relapsing multiple sclerosis. This drug is not a cure. It is also used to treat Crohn's disease. This medicine may be used for other purposes; ask your health care provider or pharmacist if you have questions. COMMON BRAND NAME(S): Tysabri What should I tell my health care provider before I take this medicine? They need to know if you have any of these conditions: -immune system problems -progressive multifocal leukoencephalopathy (PML) -an unusual or allergic reaction to natalizumab, other medicines, foods, dyes, or preservatives -pregnant or trying to get pregnant -breast-feeding How should I use this medicine? This medicine is for infusion into a vein. It is given by a health care professional in a hospital or clinic setting. A special MedGuide will be given to you by the pharmacist with each prescription and refill. Be sure to read this information carefully each time. Talk to your pediatrician regarding the use of this medicine in children. This medicine is not approved for use in children. Overdosage: If you think you have taken too much of this medicine contact a poison control center or emergency room at once. NOTE: This medicine is only for you. Do not share this medicine with others. What if I miss a dose? It is important not to miss your dose. Call your doctor or health care professional if you are unable to keep an appointment. What may interact with this medicine? -azathioprine -cyclosporine -interferon -6-mercaptopurine -methotrexate -steroid medicines like prednisone or cortisone -TNF-alpha inhibitors like adalimumab, etanercept, and infliximab -vaccines This list may not describe all possible interactions. Give your health care provider a list of all the medicines, herbs, non-prescription drugs, or dietary supplements you use. Also tell them if you smoke, drink alcohol, or use  illegal drugs. Some items may interact with your medicine. What should I watch for while using this medicine? Your condition will be monitored carefully while you are receiving this medicine. Visit your doctor for regular check ups. Tell your doctor or healthcare professional if your symptoms do not start to get better or if they get worse. Stay away from people who are sick. Call your doctor or health care professional for advice if you get a fever, chills or sore throat, or other symptoms of a cold or flu. Do not treat yourself. In some patients, this medicine may cause a serious brain infection that may cause death. If you have any problems seeing, thinking, speaking, walking, or standing, tell your doctor right away. If you cannot reach your doctor, get urgent medical care. What side effects may I notice from receiving this medicine? Side effects that you should report to your doctor or health care professional as soon as possible: -allergic reactions like skin rash, itching or hives, swelling of the face, lips, or tongue -breathing problems -changes in vision -chest pain -dark urine -depression, feelings of sadness -dizziness -general ill feeling or flu-like symptoms -irregular, missed, or painful menstrual periods -light-colored stools -loss of appetite, nausea -muscle weakness -problems with balance, talking, or walking -right upper belly pain -unusually weak or tired -yellowing of the eyes or skin Side effects that usually do not require medical attention (report to your doctor or health care professional if they continue or are bothersome): -aches, pains -headache -stomach upset -tiredness This list may not describe all possible side effects. Call your doctor for medical advice about side effects. You may report side effects to FDA at 1-800-FDA-1088. Where should I keep   my medicine? This drug is given in a hospital or clinic and will not be stored at home. NOTE: This sheet is  a summary. It may not cover all possible information. If you have questions about this medicine, talk to your doctor, pharmacist, or health care provider.  2019 Elsevier/Gold Standard (2008-10-16 13:33:21)  

## 2019-02-03 ENCOUNTER — Telehealth: Payer: Self-pay | Admitting: Neurology

## 2019-02-03 NOTE — Telephone Encounter (Signed)
She can return to work now but should take precautions (such as wearing mask), wash hands, etc.

## 2019-02-03 NOTE — Telephone Encounter (Signed)
Called to advise Pt will mail letter she may return to work with stated precautions. Her [employer] is full, could not LM, will send letter.

## 2019-02-03 NOTE — Telephone Encounter (Signed)
Patient came by the office to see if she could have a letter mailed to her stating when she can return to work. Thanks

## 2019-02-13 ENCOUNTER — Telehealth: Payer: Self-pay | Admitting: Neurology

## 2019-02-13 NOTE — Telephone Encounter (Signed)
Pt called to follow up on request for a letter to return to work. Pls call her.

## 2019-02-13 NOTE — Telephone Encounter (Signed)
Called Pt, went to VM, VM is still full. Letter was not sent, will send now.

## 2019-02-27 ENCOUNTER — Inpatient Hospital Stay (HOSPITAL_COMMUNITY)
Admission: RE | Admit: 2019-02-27 | Discharge: 2019-02-27 | Disposition: A | Discharge status: Home / Self Care | Payer: BLUE CROSS/BLUE SHIELD | Source: Ambulatory Visit

## 2019-02-27 ENCOUNTER — Other Ambulatory Visit: Payer: Self-pay

## 2019-03-24 ENCOUNTER — Telehealth: Payer: Self-pay | Admitting: Neurology

## 2019-03-24 NOTE — Telephone Encounter (Signed)
Left message with after hour service on 03-24-19 @ 1:11 pm    Caller states that she needs to speak to Dr Tomi Likens about patient

## 2019-03-27 NOTE — Telephone Encounter (Signed)
Returned call to [employer]. Could not get a rep on the phone, held for 14 minutes

## 2019-03-30 ENCOUNTER — Telehealth: Payer: Self-pay

## 2019-04-10 NOTE — Telephone Encounter (Signed)
Still have not been able to contact patient

## 2019-04-20 ENCOUNTER — Telehealth: Payer: Self-pay | Admitting: Neurology

## 2019-04-20 NOTE — Telephone Encounter (Signed)
Patient hasn't had infusion since May. She has been dropped from [facility] for infusions with no compliance behavior. Unable to reach patient/ show up for appts. Just FYI. Thanks!

## 2019-04-30 NOTE — Progress Notes (Deleted)
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  UPDATE: She hasn't gone for her Tysabri infusions since May.  Due to poor compliance, she has been dropped from [employer].  ***  Vision: No issues Motor: No issues Sensory: mild facial numbness and right hand numbness. Pain: No issues Gait: No issues Bowel/Bladder: No issues Fatigue: Yes Cognition: Mild difficulty concentrating Mood: Depressed  HISTORY: On 07/27/14, she had left optic neuritis.She had an MRI of the brain with and without contrast, which showed multiple hyperdense non-enhancing lesions in the supratentorial white matter with 5 mm enhancing lesion in the left occipital lobe.There was associated asymmetric enhancement of the left optic nerve.Labs showed ANA negative, Sed Rate 18, RPR non-reactive, TSH 6.060, free T4 1, HIV 1&2 antibodies non-reactive, and unremarkable CBC with diff.She was treated with Solumedrol 1000mg  for 5 days.Vision improved.  She has a history numbness in the left leg off and on for some time.She also will sometimes experience tingling in the right foot.She notes cold may exacerbate symptoms.She reports feeling tired but no significant fatigue.She occasionally notes twitching in her left temple.She denies muscle cramps or gait instability.She does not have family history of MS.  She started Tysabri in March 2016.  MRI of the cervical spine with and without contrast performed on 10/01/14 showed subtle T2 hyperintense lesions at the level of C3-4 and C5, without enhancement. Repeat MRI of the brain and cervical spine with and without contrast performed on 03/03/15 showed new 12 mm non-enhancing lesion in left parietal lobe and possible new tiny nonenhancing lesion at the C7 level of  spinal cord, however she had trouble receiving Tysabri because they could not get a line. She now has a port. MRI of brain and cervical spine with and without contradt from 08/15/15 showed no acute demyelination or changes from prior imaging in June 2016.  PAST MEDICAL HISTORY: Past Medical History:  Diagnosis Date  . Anxiety   . Dental crown present    right upper  . Depression   . Facial twitching 05/2018  . History of dislocation    of jaw  . History of seizure    age 71 - due to intentional overdose of multiple medications  . History of suicide attempt as a teenager  . Multiple sclerosis ([place])   . Poor venous access     MEDICATIONS: Current Outpatient Medications on File Prior to Visit  Medication Sig Dispense Refill  . cholecalciferol (VITAMIN D) 1000 units tablet Take 400 Units by mouth daily.    Marland Kitchen escitalopram (LEXAPRO) 10 MG tablet TAKE ONE (1) TABLET BY MOUTH EVERY DAY 30 tablet 5  . natalizumab (TYSABRI) 300 MG/15ML injection Inject 300 mg into the vein every 28 (twenty-eight) days.     . norgestimate-ethinyl estradiol (ORTHO-CYCLEN,SPRINTEC,PREVIFEM) 0.25-35 MG-MCG tablet Take 1 tablet by mouth daily.     No current facility-administered medications on file prior to visit.     ALLERGIES: Allergies  Allergen Reactions  . Oxycodone-Acetaminophen Nausea And Vomiting  . Other Rash    No antibiotics as it conflicts with therapy [employer]- RASH    FAMILY HISTORY: Family History  Problem Relation Age of Onset  . Rheum arthritis Mother   . Diabetes Father   . Cancer Maternal Grandmother        renal cell   .  Diabetes Maternal Grandfather   . Hypertension Maternal Grandfather   . Alzheimer's disease Maternal Grandfather   . Cancer Paternal Grandmother        lung   ***.  SOCIAL HISTORY: Social History   Socioeconomic History  . Marital status: Married    Spouse name: Not on file  . Number of children: Not on file  . Years of education: Not on file   . Highest education level: Not on file  Occupational History  . Not on file  Social Needs  . Financial resource strain: Not on file  . Food insecurity    Worry: Not on file    Inability: Not on file  . Transportation needs    Medical: Not on file    Non-medical: Not on file  Tobacco Use  . Smoking status: Former Smoker    Packs/day: 1.00    Types: Cigarettes    Quit date: 02/22/2018    Years since quitting: 1.1  . Smokeless tobacco: Never Used  Substance and Sexual Activity  . Alcohol use: Yes    Alcohol/week: 0.0 standard drinks    Comment: occasionally  . Drug use: No  . Sexual activity: Yes    Partners: Male    Birth control/protection: None  Lifestyle  . Physical activity    Days per week: Not on file    Minutes per session: Not on file  . Stress: Not on file  Relationships  . Social Musicianconnections    Talks on phone: Not on file    Gets together: Not on file    Attends religious service: Not on file    Active member of club or organization: Not on file    Attends meetings of clubs or organizations: Not on file    Relationship status: Not on file  . Intimate partner violence    Fear of current or ex partner: Not on file    Emotionally abused: Not on file    Physically abused: Not on file    Forced sexual activity: Not on file  Other Topics Concern  . Not on file  Social History Narrative   New lethargy, md aware    REVIEW OF SYSTEMS: Constitutional: No fevers, chills, or sweats, no generalized fatigue, change in appetite Eyes: No visual changes, double vision, eye pain Ear, nose and throat: No hearing loss, ear pain, nasal congestion, sore throat Cardiovascular: No chest pain, palpitations Respiratory:  No shortness of breath at rest or with exertion, wheezes GastrointestinaI: No nausea, vomiting, diarrhea, abdominal pain, fecal incontinence Genitourinary:  No dysuria, urinary retention or frequency Musculoskeletal:  No neck pain, back pain Integumentary:  No rash, pruritus, skin lesions Neurological: as above Psychiatric: No depression, insomnia, anxiety Endocrine: No palpitations, fatigue, diaphoresis, mood swings, change in appetite, change in weight, increased thirst Hematologic/Lymphatic:  No purpura, petechiae. Allergic/Immunologic: no itchy/runny eyes, nasal congestion, recent allergic reactions, rashes  PHYSICAL EXAM: *** General: No acute distress.  Patient appears ***-groomed.   Head:  Normocephalic/atraumatic Eyes:  Fundi examined but not visualized Neck: supple, no paraspinal tenderness, full range of motion Heart:  Regular rate and rhythm Lungs:  Clear to auscultation bilaterally Back: No paraspinal tenderness Neurological Exam: alert and oriented to person, place, and time. Attention span and concentration intact, recent and remote memory intact, fund of knowledge intact.  Speech fluent and not dysarthric, language intact.  CN II-XII intact. Bulk and tone normal, muscle strength 5/5 throughout.  Sensation to light touch, temperature and vibration intact.  Deep  tendon reflexes 2+ throughout, toes downgoing.  Finger to nose and heel to shin testing intact.  Gait normal, Romberg negative.  IMPRESSION: ***  PLAN: ***  Shon Millet, DO  CC: ***

## 2019-05-01 ENCOUNTER — Ambulatory Visit: Payer: BLUE CROSS/BLUE SHIELD | Admitting: Neurology

## 2019-05-15 ENCOUNTER — Encounter: Payer: Self-pay | Admitting: Neurology

## 2019-05-15 NOTE — Telephone Encounter (Signed)
Non compliance for tosabri disconinuation letter for patient. [employer] said that they faxed it about 30 days ago and have not received it back. Thanks!

## 2019-05-15 NOTE — Telephone Encounter (Signed)
Due to multiple no-show visits and non-compliance (missed Tysabri infusions), we will have to discharge Beth Santos.

## 2019-05-21 ENCOUNTER — Telehealth: Payer: Self-pay | Admitting: Neurology

## 2019-05-21 NOTE — Telephone Encounter (Signed)
Patient dismissed from [facility]  by Metta Clines DO, effective 05/15/2019. Dismissal letter was sent by registered mail. js

## 2019-05-25 ENCOUNTER — Other Ambulatory Visit: Payer: Self-pay

## 2019-05-25 ENCOUNTER — Telehealth: Payer: Self-pay | Admitting: Neurology

## 2019-05-25 ENCOUNTER — Other Ambulatory Visit (INDEPENDENT_AMBULATORY_CARE_PROVIDER_SITE_OTHER): Payer: BC Managed Care – PPO

## 2019-05-25 DIAGNOSIS — Z79899 Other long term (current) drug therapy: Secondary | ICD-10-CM

## 2019-05-25 LAB — CBC WITH DIFFERENTIAL/PLATELET
Basophils Absolute: 0 10*3/uL (ref 0.0–0.1)
Basophils Relative: 0.6 % (ref 0.0–3.0)
Eosinophils Absolute: 0.1 10*3/uL (ref 0.0–0.7)
Eosinophils Relative: 2 % (ref 0.0–5.0)
HCT: 36.1 % (ref 36.0–46.0)
Hemoglobin: 12 g/dL (ref 12.0–15.0)
Lymphocytes Relative: 34.4 % (ref 12.0–46.0)
Lymphs Abs: 1.9 10*3/uL (ref 0.7–4.0)
MCHC: 33.2 g/dL (ref 30.0–36.0)
MCV: 84.6 fl (ref 78.0–100.0)
Monocytes Absolute: 0.3 10*3/uL (ref 0.1–1.0)
Monocytes Relative: 5.7 % (ref 3.0–12.0)
Neutro Abs: 3.1 10*3/uL (ref 1.4–7.7)
Neutrophils Relative %: 57.3 % (ref 43.0–77.0)
Platelets: 252 10*3/uL (ref 150.0–400.0)
RBC: 4.26 Mil/uL (ref 3.87–5.11)
RDW: 14 % (ref 11.5–15.5)
WBC: 5.4 10*3/uL (ref 4.0–10.5)

## 2019-05-25 NOTE — Telephone Encounter (Signed)
Completed form for [employer] and faxed to [phone].   Patient was dismissed from our practice 05/21/19

## 2019-05-25 NOTE — Telephone Encounter (Signed)
Beth Santos from [employer] called to check on the status of the form for initial discontinuation on tosabri. She said it's been faxed twice and they need the completed form returned as soon as possible. See closed telephone encounter from 04/20/2019.

## 2019-05-25 NOTE — Telephone Encounter (Signed)
~  Thad Ranger you have this form?

## 2019-06-02 ENCOUNTER — Ambulatory Visit: Payer: BC Managed Care – PPO | Admitting: Neurology

## 2019-06-05 ENCOUNTER — Other Ambulatory Visit: Payer: Self-pay

## 2019-06-05 ENCOUNTER — Ambulatory Visit: Payer: BC Managed Care – PPO | Admitting: Family Medicine

## 2019-06-05 LAB — STRATIFY JCV(TM) AB W/INDEX
JCV Antibody: NEGATIVE
JCV Index Value: 0.17
